# Patient Record
Sex: Male | Born: 1970 | Race: Black or African American | Hispanic: No | Smoking: Never smoker
Health system: Southern US, Community
[De-identification: ages and names within clinical notes are randomized; demographics above are authoritative.]

## PROBLEM LIST (undated history)

## (undated) DIAGNOSIS — IMO0002 Reserved for concepts with insufficient information to code with codable children: Secondary | ICD-10-CM

## (undated) DIAGNOSIS — G4700 Insomnia, unspecified: Secondary | ICD-10-CM

## (undated) DIAGNOSIS — G629 Polyneuropathy, unspecified: Secondary | ICD-10-CM

## (undated) DIAGNOSIS — Z973 Presence of spectacles and contact lenses: Secondary | ICD-10-CM

## (undated) DIAGNOSIS — L989 Disorder of the skin and subcutaneous tissue, unspecified: Secondary | ICD-10-CM

## (undated) DIAGNOSIS — E785 Hyperlipidemia, unspecified: Secondary | ICD-10-CM

## (undated) DIAGNOSIS — R809 Proteinuria, unspecified: Secondary | ICD-10-CM

## (undated) DIAGNOSIS — G43909 Migraine, unspecified, not intractable, without status migrainosus: Secondary | ICD-10-CM

## (undated) DIAGNOSIS — I10 Essential (primary) hypertension: Secondary | ICD-10-CM

## (undated) DIAGNOSIS — E1165 Type 2 diabetes mellitus with hyperglycemia: Secondary | ICD-10-CM

## (undated) HISTORY — PX: VASECTOMY: SHX75

## (undated) HISTORY — DX: Polyneuropathy, unspecified: G62.9

## (undated) HISTORY — DX: Migraine, unspecified, not intractable, without status migrainosus: G43.909

## (undated) HISTORY — DX: Reserved for concepts with insufficient information to code with codable children: IMO0002

## (undated) HISTORY — DX: Disorder of the skin and subcutaneous tissue, unspecified: L98.9

## (undated) HISTORY — DX: Type 2 diabetes mellitus with hyperglycemia: E11.65

## (undated) HISTORY — DX: Presence of spectacles and contact lenses: Z97.3

## (undated) HISTORY — DX: Hyperlipidemia, unspecified: E78.5

## (undated) HISTORY — DX: Essential (primary) hypertension: I10

## (undated) HISTORY — DX: Proteinuria, unspecified: R80.9

---

## 1998-03-06 ENCOUNTER — Emergency Department (HOSPITAL_COMMUNITY): Admission: EM | Admit: 1998-03-06 | Discharge: 1998-03-06 | Payer: Self-pay | Admitting: Emergency Medicine

## 1998-05-01 ENCOUNTER — Emergency Department (HOSPITAL_COMMUNITY): Admission: EM | Admit: 1998-05-01 | Discharge: 1998-05-01 | Payer: Self-pay | Admitting: Emergency Medicine

## 2000-06-24 ENCOUNTER — Emergency Department (HOSPITAL_COMMUNITY): Admission: EM | Admit: 2000-06-24 | Discharge: 2000-06-24 | Payer: Self-pay | Admitting: Emergency Medicine

## 2000-10-03 ENCOUNTER — Emergency Department (HOSPITAL_COMMUNITY): Admission: EM | Admit: 2000-10-03 | Discharge: 2000-10-03 | Payer: Self-pay | Admitting: *Deleted

## 2001-03-28 ENCOUNTER — Emergency Department (HOSPITAL_COMMUNITY): Admission: EM | Admit: 2001-03-28 | Discharge: 2001-03-28 | Payer: Self-pay | Admitting: Emergency Medicine

## 2001-03-28 ENCOUNTER — Encounter: Payer: Self-pay | Admitting: Emergency Medicine

## 2003-11-25 ENCOUNTER — Emergency Department (HOSPITAL_COMMUNITY): Admission: EM | Admit: 2003-11-25 | Discharge: 2003-11-25 | Payer: Self-pay | Admitting: Family Medicine

## 2005-12-15 ENCOUNTER — Encounter: Payer: Self-pay | Admitting: Emergency Medicine

## 2006-01-23 ENCOUNTER — Inpatient Hospital Stay (HOSPITAL_COMMUNITY): Admission: EM | Admit: 2006-01-23 | Discharge: 2006-01-26 | Payer: Self-pay | Admitting: Emergency Medicine

## 2006-02-04 ENCOUNTER — Encounter: Payer: Self-pay | Admitting: Endocrinology

## 2007-10-18 ENCOUNTER — Ambulatory Visit: Payer: Self-pay | Admitting: Endocrinology

## 2007-10-18 DIAGNOSIS — Z794 Long term (current) use of insulin: Secondary | ICD-10-CM | POA: Insufficient documentation

## 2007-10-18 DIAGNOSIS — E119 Type 2 diabetes mellitus without complications: Secondary | ICD-10-CM | POA: Insufficient documentation

## 2007-10-18 DIAGNOSIS — R209 Unspecified disturbances of skin sensation: Secondary | ICD-10-CM | POA: Insufficient documentation

## 2007-10-18 DIAGNOSIS — G43909 Migraine, unspecified, not intractable, without status migrainosus: Secondary | ICD-10-CM | POA: Insufficient documentation

## 2007-11-22 ENCOUNTER — Encounter (INDEPENDENT_AMBULATORY_CARE_PROVIDER_SITE_OTHER): Payer: Self-pay | Admitting: *Deleted

## 2008-01-16 ENCOUNTER — Encounter: Payer: Self-pay | Admitting: Endocrinology

## 2008-01-17 ENCOUNTER — Telehealth: Payer: Self-pay | Admitting: Endocrinology

## 2008-01-19 ENCOUNTER — Telehealth: Payer: Self-pay | Admitting: Endocrinology

## 2011-01-01 NOTE — Discharge Summary (Signed)
NAME:  [name], [name]                 ACCOUNT NO.:  [id number]   MEDICAL RECORD NO.:  [id number]          PATIENT TYPE:  INP   LOCATION:  5732                         FACILITY:  [facility]   PHYSICIAN:  Melissa L. Ladona Ridgel, MD  DATE OF BIRTH:  Sep 21, 1970   DATE OF ADMISSION:  01/23/2006  DATE OF DISCHARGE:  01/26/2006                                 DISCHARGE SUMMARY   CHIEF COMPLAINT ON ADMISSION:  Elevated blood sugars with diagnosis of DKA  based on his anion gap.   DISCHARGE DIAGNOSES:  1. Uncontrolled diabetes with diabetic ketoacidosis. The patient was      hydrated and started on Glucomander with ultimate control of his blood      sugars. He was restarted on his oral medications after reaching goal      blood sugar control which included Lantus. At the time of discharge,      the patient's blood sugars were still slightly elevated, but he was      treated for apical abscess and referred for outpatient continued      management of his diabetes.  2. Apical abscess. The patient evidently had dental abscess which he did      not reveal at the time of admission. He was started empirically on      Augmentin and discharged to home with Augmentin.  3. Acute renal failure. This resolved with hydration. ACE inhibitor was      recommended in the future once he was more stable, but at this time,      because of the recent renal failure, this was not started as an      inpatient. Leukocytosis had resolved at the time of discharge likely      secondary to his metabolic derangement and apical abscess.   MEDICATIONS AT DISCHARGE:  1. Augmentin 875 p.o. b.i.d.  2. Lantus 20 units subcutaneously daily.  3. Glucophage 850 b.i.d.  4. Protonix 40 mg daily.  5. Actos 15 mg daily.  6. Percocet 1 to 2 tablets every 4 hours.  7. Ambien 5 mg every night p.r.n. for sleep.   FOLLOW UP:  The patient was to follow up with Dr. Georgina Pillion in 10 to 14 days  and was referred to the dentist of his choice.   SPECIAL  INSTRUCTIONS:  He is to report to his doctor if he has any problems  related to elevated blood sugars and to follow up for education at the  diabetes center.   HOSPITAL COURSE:  The patient is a pleasant 40 year old African-American  male who presented to the hospital with fatigue and elevated blood sugars.  He was found to have a fasting blood sugar of 925 with an anion gap. The  patient was admitted to the hospital, placed on a Glucomander insulin drip  which was titrated to effect. It was discovered during the course of the  hospital stay that the patient actually had a dental abscess which he had  not revealed. Therefore, he was started on Augmentin. He was aggressively  rehydrated which improved his acute renal failure which noted at  the time of  admission. The patient's pain was controlled with multiple medications, and  at the time of admission, he was discharged to home on oral Percocet as well  as Augmentin.   On the day of discharge, the patient was noted to be hemodynamically stable  by Dr. Greggory Stallion Osei-Bonsu with good blood pressure. His blood sugars remained  slightly elevated, but he was referred for outpatient diabetic care and to  his primary care physician for further monitoring.   PERTINENT LABORATORY DATA:  At the time of discharge, the patient remained  afebrile. His white count on admission was 14.7, and at the time discharge,  it was 5.4. His BUN and creatinine at the time of admission were 20 and 2.0,  respectively, but at discharge, they went down to 14 and 1.0, respectively.  His hemoglobin A1c was 12.6. His urinalysis was negative. His cardiac  markers were negative.   DISPOSITION:  At the time of discharge, the patient was deemed stable for  followup as an outpatient with his dentist and his primary care physician.  He was discharged to home. Condition was stable.      Melissa L. Ladona Ridgel, MD  Electronically Signed     MLT/MEDQ  D:  04/01/2006  T:   04/01/2006  Job:  [id number]   cc:   Oley Balm. Georgina Pillion, M.D.

## 2011-01-01 NOTE — H&P (Signed)
NAME:  [name] [name], [name] [name]                 ACCOUNT NO.:  [id number]   MEDICAL RECORD NO.:  [id number]          PATIENT TYPE:  INP   LOCATION:  1825                         FACILITY:  [facility]   PHYSICIAN:  Hollice Espy, M.D.DATE OF BIRTH:  13-Apr-1971   DATE OF ADMISSION:  01/23/2006  DATE OF DISCHARGE:                                HISTORY & PHYSICAL   ATTENDING PHYSICIAN:  Nadear A. Arthor Captain, M.D.   PRIMARY CARE PHYSICIAN:  Oley Balm. Georgina Pillion, M.D.   CHIEF COMPLAINT:  Nausea, vomiting, and weakness.   HISTORY OF PRESENT ILLNESS:  The patient is a 40 year old African-American  male with past medical history of glucose intolerance who presents to the  emergency room after several days of weakness.  The patient tells me that  approximately 6 months ago he was diagnosed with borderline diabetes.  He  followed up with his primary care physician approximately 4-5 weeks ago, and  it was found his sugars were still high.  He was started on oral diabetic  medications, and the patient tells me that he has been trying to take his  medications; however, whenever he checks his sugar, it has always been  reported as high.  Over the last several weeks, he tells me that he has been  sleeping very minimally because he feels like he is up all the time  urinating.  To compensate for this, he has been trying to drink lots of  water, but it has been to no avail.  He continued to have problems and felt  so weak to today that he finally came in to the emergency room.   In the emergency room, the patient was noted to sodium of 122, a BUN of 20,  creatinine of 2, and a glucose of 925.  The patient had an anion gap of 19.  Vial signs were taken on the patient.  He was noted to be slightly  tachycardic with a heart rate of 104. Blood pressure was stable at 132/91.  The rest of his labs were checked, and he was noted to have a urine glucose  of greater than 1000 and 40 ketones.  Following administration of IV  fluids  and insulin, the patient's BUN had come down to 24 with a creatinine of 1.4.  CPK was only slightly elevated at 237.  The patient denied any other  complaints other than feeling extremely fatigued and weak.  He denies any  headache, vision changes, dysphagia, chest pain, palpitations, shortness of  breath, wheeze, cough, abdominal pain, hematuria, dysuria, constipation,  diarrhea, focal extremity numbness or weakness or pain.  Overall, he feels  quite weak and fatigued.   REVIEW OF SYSTEMS:  Otherwise negative.   PAST MEDICAL HISTORY:  Includes glucose intolerance and now full-blown  diabetes, type 2.   MEDICATIONS:  The patient had just been recently started in the past 6 weeks  on ACTO plus met 15/500 p.o. b.i.d.   ALLERGIES:  The patient has no known drug allergies.   SOCIAL HISTORY:  He denies any tobacco or drug use.  He occasionally drinks  a beer socially.   FAMILY HISTORY:  Notable for diabetes.   PHYSICAL EXAMINATION:  VITAL SIGNS:  The patient's vital signs on admission  show temperature 98.4, heart rate 104, now down to 93, blood pressure  132/91, respirations 18, O2 saturation 99% on room air.  GENERAL: The patient is alert and oriented x3, in no apparent distress.  HEENT:  Normocephalic and atraumatic.  Mucous membranes are dry.  NECK:  He has no carotid bruits.  HEART:  Regular rate and rhythm.  S1, S2.  LUNGS: Clear to auscultation bilaterally.  ABDOMEN: Soft, obese, nontender.  Positive bowel sounds.  EXTREMITIES:  No clubbing, cyanosis, or edema.   LABORATORY DATA:  The patient's initial BMET showed sodium 122, potassium  7.4, chloride 86, bicarb 17, BUN 20, creatinine 2, glucose 925.  UA: Greater  than 1000 glucose, 40 ketones, 2 red cells.  CPK 237, MB less than 1,  troponin I less than 0.05.  Second BMET approximately 1 hour later: Sodium  122, potassium 6.4 by hemolyzed, BUN 24, creatinine 1.4.   ASSESSMENT AND PLAN:  1.  Nonketotic hyperglycemia  with some mild component of acidosis.  Would      favor at this time treating as a nonketotic given the elevated blood      sugars and his history.  Would favor lots of IV hydration as well as      insulin Glucomander.  Will continue to follow.  Once his sugars are      below 200, will change him over to insulin sliding scale 3 times a day.  2.  Acute renal failure, already improved with some IV fluids.  Continue IV      fluids until patient is back to normal.  I do not feel at this time he      has any baseline renal insufficiency; however, continued tight control      of his  diabetes will ensure that he stays this way.  3.  Anion gap acidosis.  This may be from uremia versus his renal failure.      We will treat and recheck a BMET in the morning.  4.  Polyuria secondary to hyperglycemia.  5.  Obesity.      Hollice Espy, M.D.  Electronically Signed     SKK/MEDQ  D:  01/23/2006  T:  01/23/2006  Job:  [id number]   cc:   Oley Balm. Georgina Pillion, M.D.  Fax: [phone]

## 2012-07-05 ENCOUNTER — Ambulatory Visit (INDEPENDENT_AMBULATORY_CARE_PROVIDER_SITE_OTHER): Payer: BC Managed Care – PPO | Admitting: Medical

## 2012-07-05 ENCOUNTER — Encounter: Payer: Self-pay | Admitting: Medical

## 2012-07-05 VITALS — BP 118/80 | HR 72 | Temp 98.2°F | Resp 16 | Ht 70.0 in | Wt 177.0 lb

## 2012-07-05 DIAGNOSIS — E785 Hyperlipidemia, unspecified: Secondary | ICD-10-CM

## 2012-07-05 DIAGNOSIS — G2581 Restless legs syndrome: Secondary | ICD-10-CM

## 2012-07-05 DIAGNOSIS — Z23 Encounter for immunization: Secondary | ICD-10-CM

## 2012-07-05 DIAGNOSIS — Z0000 Encounter for general adult medical examination without abnormal findings: Secondary | ICD-10-CM

## 2012-07-05 DIAGNOSIS — IMO0001 Reserved for inherently not codable concepts without codable children: Secondary | ICD-10-CM

## 2012-07-05 LAB — CBC WITH DIFFERENTIAL/PLATELET
Basophils Absolute: 0 10*3/uL (ref 0.0–0.1)
Eosinophils Absolute: 0 10*3/uL (ref 0.0–0.7)
Eosinophils Relative: 1 % (ref 0–5)
HCT: 51 % (ref 39.0–52.0)
Lymphocytes Relative: 37 % (ref 12–46)
Lymphs Abs: 2.2 10*3/uL (ref 0.7–4.0)
MCH: 28.2 pg (ref 26.0–34.0)
MCV: 84.2 fL (ref 78.0–100.0)
Monocytes Absolute: 0.4 10*3/uL (ref 0.1–1.0)
Platelets: 380 10*3/uL (ref 150–400)
RDW: 13.4 % (ref 11.5–15.5)

## 2012-07-05 LAB — POCT URINALYSIS DIPSTICK
Glucose, UA: NEGATIVE
Leukocytes, UA: NEGATIVE
Nitrite, UA: NEGATIVE
Spec Grav, UA: 1.02
Urobilinogen, UA: NEGATIVE
pH, UA: 5

## 2012-07-05 LAB — COMPREHENSIVE METABOLIC PANEL
ALT: 11 U/L (ref 0–53)
Alkaline Phosphatase: 84 U/L (ref 39–117)
Creat: 0.95 mg/dL (ref 0.50–1.35)
Sodium: 134 mEq/L — ABNORMAL LOW (ref 135–145)
Total Bilirubin: 2.3 mg/dL — ABNORMAL HIGH (ref 0.3–1.2)
Total Protein: 7.9 g/dL (ref 6.0–8.3)

## 2012-07-05 LAB — HEMOGLOBIN A1C: Hgb A1c MFr Bld: 14 % — ABNORMAL HIGH (ref <5.7)

## 2012-07-05 LAB — LIPID PANEL
Cholesterol: 277 mg/dL — ABNORMAL HIGH (ref 0–200)
HDL: 39 mg/dL — ABNORMAL LOW (ref >39)
LDL Cholesterol: 221 mg/dL — ABNORMAL HIGH (ref 0–99)
Triglycerides: 84 mg/dL (ref <150)

## 2012-07-05 NOTE — Progress Notes (Signed)
Subjective:   HPI  [name] [name] is a 41 y.o. male who presents for a complete physical.  New patient today.   He reports being on medication for diabetes and cholesterol over a year ago, diagnosed age 66yo, but let things go this past year going through bad divorce.  He wants to get back on track.  He was formerly 250lb but had lost a lot of weight.     Preventative care: Last ophthalmology visit:6 months Last dental visit:Dr.hobbs,every 22mo Last colonoscopy:n/a Last prostate exam: 5 years ago Last EKG:n/a Last labs:n/a  Prior vaccinations: TD or Tdap:unsure Influenza:05/2012 Pneumococcal:n/a Shingles/Zostavax:n/a  Advanced directive:n/a Health care power of attorney:n/a Living will:n/a  Concerns: diabetes - not checking glucose, no meter, but tries to eat healthy, does get exercise with walking at work.  No medication in over a year but knows his numbers are going to be out of whack  Leg discomfort, usually in the evening, not specifically pain, not particular restless/moving, but achy bilat.  Not worsened with walking.  Worse at rest.  Reviewed their medical, surgical, family, social, medication, and allergy history and updated chart as appropriate.   Past Medical History  Diagnosis Date  . Hyperlipidemia   . Wears glasses   . Migraine   . Unspecified disorder of skin and subcutaneous tissue     recurrent summertime rash, large lesions scattered?  . Diabetes mellitus type II, uncontrolled     diagnosed age 72yo; week long hospitalization at diagnosis     Past Surgical History  Procedure Date  . Vasectomy     Family History  Problem Relation Age of Onset  . Diabetes Maternal Aunt   . Diabetes Maternal Uncle   . Diabetes Maternal Grandmother   . Heart disease Neg Hx   . Stroke Neg Hx   . Cancer Neg Hx     History   Social History  . Marital Status: Married    Spouse Name: N/A    Number of Children: N/A  . Years of Education: N/A   Occupational  History  . Not on file.   Social History Main Topics  . Smoking status: Never Smoker   . Smokeless tobacco: Not on file  . Alcohol Use: No  . Drug Use: No  . Sexually Active: Not on file   Other Topics Concern  . Not on file   Social History Narrative   Divorced, 2 children 14yo, 16yo, lives with brother and daughter, is an Event organiser for [employer]    No current outpatient prescriptions on file prior to visit.    No Known Allergies  Review of Systems Constitutional: -fever, -chills, -sweats, +unexpected weight change, -decreased appetite, -fatigue Allergy: -sneezing, -itching, -congestion Dermatology: -changing moles, --rash, -lumps ENT: -runny nose, -ear pain, -sore throat, -hoarseness, -sinus pain, -teeth pain, - ringing in ears, -hearing loss, -nosebleeds Cardiology: -chest pain, -palpitations, -swelling, -difficulty breathing when lying flat, -waking up short of breath Respiratory: +cough, -shortness of breath, -difficulty breathing with exercise or exertion, -wheezing, -coughing up blood Gastroenterology: -abdominal pain, -nausea, -vomiting, -diarrhea, -constipation, -blood in stool, -changes in bowel movement, -difficulty swallowing or eating Hematology: -bleeding, -bruising  Musculoskeletal: -joint aches, -muscle aches, -joint swelling, -back pain, -neck pain, -cramping, -changes in gait, + leg pain Ophthalmology: denies vision changes, eye redness, itching, discharge Urology: -burning with urination, -difficulty urinating, -blood in urine, -urinary frequency, -urgency, -incontinence Neurology: -headache, -weakness, -tingling, -numbness, -memory loss, -falls, -dizziness Psychology: -depressed mood, -agitation, +sleep problems  Objective:   Physical Exam  Filed Vitals:   07/05/12 0941  Height: 5\' 10"  (1.778 m)  Weight: 177 lb (80.287 kg)   General appearance: alert, no distress, WD/WN, pleasant AA male Skin: no foot lesions.  Loose skin with stretch  marks along lower abdomen, from prior pannus, similar loose skin of buttocks HEENT: normocephalic, conjunctiva/corneas normal, sclerae anicteric, PERRLA, EOMi, nares patent, no discharge or erythema, pharynx normal Oral cavity: MMM, tongue normal, teeth in good repair Neck: supple, no lymphadenopathy, no thyromegaly, no masses, normal ROM, no bruits Chest: non tender, normal shape and expansion Heart: RRR, normal S1, S2, no murmurs Lungs: CTA bilaterally, no wheezes, rhonchi, or rales Abdomen: +bs, soft, non tender, non distended, no masses, no hepatomegaly, no splenomegaly, no bruits Back: non tender, normal ROM, no scoliosis Musculoskeletal: upper extremities non tender, no obvious deformity, normal ROM throughout, lower extremities non tender, no obvious deformity, normal ROM throughout Extremities: no edema, no cyanosis, no clubbing Pulses: 2+ symmetric, upper and lower extremities, normal cap refill Neurological: alert, oriented x 3, CN2-12 intact, strength normal upper extremities and lower extremities, sensation normal throughout, DTRs 2+ throughout, no cerebellar signs, gait normal, monofilament exam normal Psychiatric: normal affect, behavior normal, pleasant  GU: normal male external genitalia, circumcised, nontender, no masses, no hernia, no lymphadenopathy Rectal: anus normal appearing, normal tone, no obvious lesions, prostate WNL   Assessment and Plan :      Encounter Diagnoses  Name Primary?  . Routine general medical examination at a health care facility Yes  . Type II or unspecified type diabetes mellitus without mention of complication, uncontrolled   . Hyperlipidemia   . Need for Tdap vaccination   . Need for pneumococcal vaccination   . RLS (restless legs syndrome)     Physical exam - discussed healthy lifestyle, diet, exercise, preventative care, vaccinations, and addressed their concerns.    Labs today for baseline, most likely will restart medication for  cholesterol, diabetes.  Advised he needs to take care of himself and discussed possible consequences of uncontrolled diabetes.  tdap vaccine, VIS and counseling given  pneumococcal vaccine, VIS, and counseling given  Just had flu vaccine a month ago  RLS - symptoms suggest RLQ, vs neuropathy.  Pending labs, consider Requip.  Follow-up pending labs

## 2012-07-06 ENCOUNTER — Encounter: Payer: Self-pay | Admitting: Medical

## 2012-07-06 LAB — MICROALBUMIN / CREATININE URINE RATIO: Microalb Creat Ratio: 60.4 mg/g — ABNORMAL HIGH (ref 0.0–30.0)

## 2012-07-07 ENCOUNTER — Other Ambulatory Visit: Payer: Self-pay | Admitting: Medical

## 2012-07-07 ENCOUNTER — Telehealth: Payer: Self-pay | Admitting: Family Medicine

## 2012-07-07 MED ORDER — INSULIN ASPART 100 UNIT/ML ~~LOC~~ SOLN: 10.0000 [IU] | Freq: Three times a day (TID) | SUBCUTANEOUS | Status: DC

## 2012-07-07 NOTE — Telephone Encounter (Signed)
i spoke to pt.  He has used insulin in the past.   i called out novolog flexpen.  He will begin 10 units AC, check glucose TID and f/u Monday.    i advised we get insulin on board to lower his sugars.

## 2012-07-07 NOTE — Telephone Encounter (Signed)
Patients prior medication from the pharmacy are Altace 5 mg, Simivastin 20 mg, Amaryl 4 mg, Jaunmet 50/ 5 mg, Lisinopril 10 mg, Glipizide , Metformin 850 mg, Zocor 20 mg, Glucotrol XR 500 mg, and Actos 45 mg. CLS [facility] pharmacy

## 2012-07-10 ENCOUNTER — Ambulatory Visit (INDEPENDENT_AMBULATORY_CARE_PROVIDER_SITE_OTHER): Payer: BC Managed Care – PPO | Admitting: Medical

## 2012-07-10 ENCOUNTER — Encounter: Payer: Self-pay | Admitting: Medical

## 2012-07-10 VITALS — BP 122/80 | HR 88 | Temp 97.8°F | Resp 16 | Wt 178.0 lb

## 2012-07-10 DIAGNOSIS — IMO0001 Reserved for inherently not codable concepts without codable children: Secondary | ICD-10-CM

## 2012-07-10 DIAGNOSIS — R809 Proteinuria, unspecified: Secondary | ICD-10-CM

## 2012-07-10 DIAGNOSIS — E785 Hyperlipidemia, unspecified: Secondary | ICD-10-CM

## 2012-07-10 MED ORDER — ATORVASTATIN CALCIUM 40 MG PO TABS: 40.0000 mg | ORAL_TABLET | Freq: Every day | ORAL | Status: DC

## 2012-07-10 MED ORDER — LISINOPRIL 5 MG PO TABS: 5.0000 mg | ORAL_TABLET | Freq: Every day | ORAL | Status: DC

## 2012-07-10 NOTE — Patient Instructions (Addendum)
Sliding Scale dosing for your insulin.  Pre- meal glucose reading: If glucose is 60 or less, give 0 units of insulin/Novolog Flex Pen If glucose is 100 - 150, give 10 units of insulin/Novolog Flex Pen If glucose is 151 - 200, give 12 units of insulin/Novolog Flex Pen If glucose is 201 - 250, give 14 units of insulin/Novolog Flex Pen If glucose is 251 - 300, give 16 units of insulin/Novolog Flex Pen If glucose is 301 - 350, give 18 units of insulin/Novolog Flex Pen If glucose is 351 - 400, give 20 units of insulin/Novolog Flex Pen If glucose is 401 - 450, give 22 units of insulin/Novolog Flex Pen If glucose is 451 call me   Begin Lipitor for cholesterol, once daily at bedtime. Begin Lisinopril 5mg  daily for kidney protection

## 2012-07-10 NOTE — Progress Notes (Signed)
Subjective: Here for diabetes f/u.  I saw him last week as a new patient .  His labs are abnormal and he is here to discuss.   He has hx/o diabetes, high cholesterol, no medication in at least a year while he was going through divorce and stress related to this.   Past Medical History  Diagnosis Date  . Hyperlipidemia   . Wears glasses   . Migraine   . Unspecified disorder of skin and subcutaneous tissue     recurrent summertime rash, large lesions scattered?  . Diabetes mellitus type II, uncontrolled     diagnosed age 41yo; week long hospitalization at diagnosis  . Microalbuminuria    Objective; Gen: wd, wn, nad  Assessment: Encounter Diagnoses  Name Primary?  . Type II or unspecified type diabetes mellitus without mention of complication, uncontrolled Yes  . Microalbuminuria   . Hyperlipidemia    Plan: He has started the insulin I called out over the weekend.   I reviewed his labs, discussed need to improve the toxicity of the high glucose in the short term, but to then transition to an ongoing regimen.   Printed sliding scale for him to use.  Begin back on Lisinopril for renal protection, Lipitor for hyperlipidemia, discussed diet, discussed monitoring glucose, using the log book, and demonstrated proper use of the Novolog Flexpen.  F/u in 39mo.    In 2 wk once he is comfortable with the plan, will add Actoplusmet to the regimen.  Of note, HgbA1C 14.0% with 298 mg/dl glucose on 16/10/96.  LDL 221.

## 2012-07-20 ENCOUNTER — Telehealth: Payer: Self-pay | Admitting: Medical

## 2012-07-20 NOTE — Telephone Encounter (Signed)
Tell him to make sure he drinks plenty of fluids, use as a bulk laxative like Metamucil on a daily basis. He still has trouble, have him set up an appointment for further discussion with Vincenza Hews

## 2012-07-20 NOTE — Telephone Encounter (Signed)
Patient is aware of what Dr. Susann Givens had suggested. CLS

## 2012-08-21 ENCOUNTER — Encounter: Payer: Self-pay | Admitting: Medical

## 2012-08-21 ENCOUNTER — Ambulatory Visit (INDEPENDENT_AMBULATORY_CARE_PROVIDER_SITE_OTHER): Payer: BC Managed Care – PPO | Admitting: Medical

## 2012-08-21 ENCOUNTER — Other Ambulatory Visit: Payer: Self-pay | Admitting: Medical

## 2012-08-21 VITALS — BP 122/80 | HR 72 | Temp 98.3°F | Resp 16 | Wt 178.0 lb

## 2012-08-21 DIAGNOSIS — R809 Proteinuria, unspecified: Secondary | ICD-10-CM

## 2012-08-21 DIAGNOSIS — E785 Hyperlipidemia, unspecified: Secondary | ICD-10-CM

## 2012-08-21 DIAGNOSIS — K5900 Constipation, unspecified: Secondary | ICD-10-CM

## 2012-08-21 DIAGNOSIS — F430 Acute stress reaction: Secondary | ICD-10-CM

## 2012-08-21 DIAGNOSIS — G4700 Insomnia, unspecified: Secondary | ICD-10-CM

## 2012-08-21 DIAGNOSIS — IMO0001 Reserved for inherently not codable concepts without codable children: Secondary | ICD-10-CM

## 2012-08-21 MED ORDER — INSULIN GLARGINE 100 UNIT/ML ~~LOC~~ SOLN: 15.0000 [IU] | Freq: Every day | SUBCUTANEOUS | Status: DC

## 2012-08-21 MED ORDER — METFORMIN HCL 500 MG PO TABS: 500.0000 mg | ORAL_TABLET | Freq: Two times a day (BID) | ORAL | Status: DC

## 2012-08-21 MED ORDER — INSULIN DETEMIR 100 UNIT/ML ~~LOC~~ SOLN: 15.0000 [IU] | Freq: Every day | SUBCUTANEOUS | Status: DC

## 2012-08-21 NOTE — Patient Instructions (Signed)
Begin Metformin tablet by mouth.  Start 1 tablet daily for 3 days, then go to twice daily.  This is 500mg  twice daily.  Begin Lantus solostar pen.   This will be 15 units injected SQ at bedtime EVERY night.  This is a basal long acting insulin.    Continue Novolog Flexpen sliding scale at meal time.  This is short acting insulin.    Continue checking glucose numbers and call me with number in 1.5 weeks.  For sleep, try either Melatonin 1mg  OTC nightly or Benadryl 25mg  OTC nightly.   If this is helping sleep, let me know.

## 2012-08-21 NOTE — Progress Notes (Signed)
Subjective:   HPI  [name] [name] is a 42 y.o. male who presents for f/u.  Last visit we found his HgbA1C to be over 14%.  He had been off diabetes medication for some time.  We started him back on Novolog sliding scale insulin, lisinopril for renal protection, Lipitor for hyperlipidemia all of which he is doing but he notes feeling bad, is constipation, has no energy, not sure if its his body getting use to the  Medication vs stress and depressed mood dealing with custody battle with ex over his 14yo son.  He also c/o pain and tingling in back and top of feet as he did on prior visit that we discussed was likely some neuropathy.  No other c/o.  The following portions of the patient's history were reviewed and updated as appropriate: allergies, current medications, past family history, past medical history, past social history, past surgical history and problem list.  Past Medical History  Diagnosis Date  . Hyperlipidemia   . Wears glasses   . Migraine   . Unspecified disorder of skin and subcutaneous tissue     recurrent summertime rash, large lesions scattered?  . Diabetes mellitus type II, uncontrolled     diagnosed age 46yo; week long hospitalization at diagnosis  . Microalbuminuria     No Known Allergies   Review of Systems ROS reviewed and was negative other than noted in HPI or above.    Objective:   Physical Exam  General appearance: alert, no distress, WD/WN Psych: pleasant, answers questions appropriately  Assessment and Plan :     Encounter Diagnoses  Name Primary?  . Type II or unspecified type diabetes mellitus without mention of complication, uncontrolled Yes  . Microalbuminuria   . Hyperlipidemia   . Constipation   . Insomnia   . Acute stress reaction    Diabetes Type II - change in regimen today.   Add Lantus Solostar pen 15 units QHS, add Metformin 500mg  BID (which he has used in the past), c/t Novolog sliding scale, c/t checking glucose, call if any  hypoglycemia, and call in glucose readings in 1.5 weeks.   He is doing a good job keeping up with his readings on his phone.   Will likely tweak his lantus in 1.5 wk.  Plan to recheck labs in near future, next month or so.  Microalbuminuria - c/t Lisinopril  Hyperlipidemia- c/t Lipitor  Constipation - increase water and fiber intake, lets see what happens after beginning back on Metformin  Insomnia - try Melatonin or Benadryl OTC QHS prn  Acute stress reaction - discussed his concerns, consider counseling

## 2012-08-22 ENCOUNTER — Telehealth: Payer: Self-pay | Admitting: Family Medicine

## 2012-08-22 NOTE — Telephone Encounter (Signed)
Message copied by Janeice Robinson on Tue Aug 22, 2012 11:18 AM ------      Message from: Aleen Campi, DAVID S      Created: Mon Aug 21, 2012  8:22 PM       i called out Levemir instead of Lantus - its bascially the same type of medication.  Lets see if this is cheaper.  If not reasonable, let me know.  The dial up pens can be much more expensive.  If too costly, we can go for the multi use vials where he has to draw up the syringe, but just let me know.            Remind him that I want to see glucose numbers in 1.5 wk

## 2012-08-22 NOTE — Telephone Encounter (Signed)
Patient was made aware of the medication change and to let us know if the cost is going to be cheaper. CLS

## 2012-10-16 ENCOUNTER — Telehealth: Payer: Self-pay | Admitting: Internal Medicine

## 2012-10-16 NOTE — Telephone Encounter (Signed)
PT MADE APPT FOR 3/5

## 2012-10-16 NOTE — Telephone Encounter (Signed)
Due back for diabetes f/u.  pls make appt.  We can discuss sleep medications.

## 2012-10-16 NOTE — Telephone Encounter (Signed)
Pt states that he has tried over the counter sleeping meds and nothing is working and pt is just exhausted and can not get any rest and would like you to send in a sleep med so he can get some rest. The levemir that was prescribed in January has worked and his blood sugar have not been above 150 since being on med. His legs are still hurting him but some what improved

## 2012-10-16 NOTE — Telephone Encounter (Signed)
Send to [facility]- on [address]

## 2012-10-18 ENCOUNTER — Encounter: Payer: Self-pay | Admitting: Medical

## 2012-10-18 ENCOUNTER — Ambulatory Visit (INDEPENDENT_AMBULATORY_CARE_PROVIDER_SITE_OTHER): Payer: BC Managed Care – PPO | Admitting: Medical

## 2012-10-18 VITALS — BP 110/80 | HR 68 | Temp 98.4°F | Resp 16 | Ht 70.5 in | Wt 165.0 lb

## 2012-10-18 DIAGNOSIS — G4700 Insomnia, unspecified: Secondary | ICD-10-CM

## 2012-10-18 DIAGNOSIS — E785 Hyperlipidemia, unspecified: Secondary | ICD-10-CM

## 2012-10-18 DIAGNOSIS — R809 Proteinuria, unspecified: Secondary | ICD-10-CM

## 2012-10-18 DIAGNOSIS — R634 Abnormal weight loss: Secondary | ICD-10-CM

## 2012-10-18 DIAGNOSIS — IMO0001 Reserved for inherently not codable concepts without codable children: Secondary | ICD-10-CM

## 2012-10-18 MED ORDER — TRAZODONE HCL 50 MG PO TABS: 25.0000 mg | ORAL_TABLET | Freq: Every day | ORAL | Status: DC

## 2012-10-18 MED ORDER — METFORMIN HCL 1000 MG PO TABS: 1000.0000 mg | ORAL_TABLET | Freq: Two times a day (BID) | ORAL | Status: DC

## 2012-10-18 MED ORDER — ZOLPIDEM TARTRATE 10 MG PO TABS: 10.0000 mg | ORAL_TABLET | Freq: Every evening | ORAL | Status: DC | PRN

## 2012-10-18 NOTE — Progress Notes (Signed)
Subjective: Here for sleep concerns.  Averaging 3 hours nightly.  Been dealing with legal custody hearings, but he has come to peace with some decisions with his son and the custody issue.  Can't get through full workday due to being sleepy. Really affecting his day to day function.  Goes to bed but has trouble falling and staying asleep.   Has used zolpidem in the past.   Using some benadryl without much relief.  Wants to try some medication for sleep.  Diabetes- checking glucose regularly both before and after meals.  Compliant with medication, exercising some.  Has some occasional nausea.   No vomiting, diarrhea.  constipation symptom are better than last visit.  He has been losing weight of late.    Taking the cholesterol medication and renal protective medication without c/o.    Past Medical History  Diagnosis Date  . Hyperlipidemia   . Wears glasses   . Migraine   . Unspecified disorder of skin and subcutaneous tissue     recurrent summertime rash, large lesions scattered?  . Diabetes mellitus type II, uncontrolled     diagnosed age 42yo; week long hospitalization at diagnosis  . Microalbuminuria    Objective: Filed Vitals:   10/18/12 1336  BP: 110/80  Pulse: 68  Temp: 98.4 F (36.9 C)  Resp: 16    General appearance: alert, no distress, WD/WN  HEENT: normocephalic, sclerae anicteric, TMs pearly, nares patent, no discharge or erythema, pharynx normal Oral cavity: MMM, no lesions Neck: supple, no lymphadenopathy, no thyromegaly, no masses Heart: RRR, normal S1, S2, no murmurs Lungs: CTA bilaterally, no wheezes, rhonchi, or rales Abdomen: +bs, soft, non tender, non distended, no masses, no hepatomegaly, no splenomegaly Pulses: 2+ symmetric, upper and lower extremities, normal cap refill, Skin: no foot lesions, no callous, no would, normal monofilament exam  Assessment: Encounter Diagnoses  Name Primary?  . Insomnia Yes  . Weight loss   . Type II or unspecified type  diabetes mellitus without mention of complication, uncontrolled   . Hyperlipidemia   . Microalbuminuria     Plan: insomnia - trial of Trazodone QHS, prn script for zolpidem.  Discussed sleep hygiene, handout today.  Discussed risks/benefits of medication.  Recheck 1 wk pending medications.  Weight loss - likely adverse effect due to levemir and healthier diet.   We will modify medications today.  Return for fasting labs next week.  DM type II - we will increase Metformin 1000mg  BID, will back off Levemir only if ongoing weight loss and much lower glucose numbers  Hyperlipidemia - reutrn for fasting labs, compliant with medication  Microalbuminuria - c/t current medication.

## 2012-10-18 NOTE — Patient Instructions (Addendum)
Sleep   Begin Trazodone 50mg , 1 tablet nightly.     Short term as needed, you can also use Ambien 10mg , 1/2 or 1 tablet to help aid in getting to sleep.   This is something we don't want you to take every single though  Diabetes:  Just check before meal glucose readings.   For now, you can check 1-2 readings daily  Increase Metformin 500mg , to 2 tablets twice daily, starting by taking 2 in the morning, 1 at night for a week, then go to 2 tablets both morning and night  When you get the new prescription, it will be for 1000mg , taking 1 twice daily   Levemir - you are currently taking 20 units at bedtime.   The goal is to have pre meal glucose 60-130.  As long as you are not seeing low readings close to 60 before meals, continue 20 unites of Levemir at bedtime.     If you are seeing routine numbers on the low side, 60-70 reading, then possibly cut back to 15 units Levemir at bedtime.    return next week fasting for labs.    Insomnia Insomnia is frequent trouble falling and/or staying asleep. Insomnia can be a long term problem or a short term problem. Both are common. Insomnia can be a short term problem when the wakefulness is related to a certain stress or worry. Long term insomnia is often related to ongoing stress during waking hours and/or poor sleeping habits. Overtime, sleep deprivation itself can make the problem worse. Every little thing feels more severe because you are overtired and your ability to cope is decreased. CAUSES   Stress, anxiety, and depression.  Poor sleeping habits.  Distractions such as TV in the bedroom.  Naps close to bedtime.  Engaging in emotionally charged conversations before bed.  Technical reading before sleep.  Alcohol and other sedatives. They may make the problem worse. They can hurt normal sleep patterns and normal dream activity.  Stimulants such as caffeine for several hours prior to bedtime.  Pain syndromes and shortness of breath can  cause insomnia.  Exercise late at night.  Changing time zones may cause sleeping problems (jet lag). It is sometimes helpful to have someone observe your sleeping patterns. They should look for periods of not breathing during the night (sleep apnea). They should also look to see how long those periods last. If you live alone or observers are uncertain, you can also be observed at a sleep clinic where your sleep patterns will be professionally monitored. Sleep apnea requires a checkup and treatment. Give your caregivers your medical history. Give your caregivers observations your family has made about your sleep.  SYMPTOMS   Not feeling rested in the morning.  Anxiety and restlessness at bedtime.  Difficulty falling and staying asleep. TREATMENT   Your caregiver may prescribe treatment for an underlying medical disorders. Your caregiver can give advice or help if you are using alcohol or other drugs for self-medication. Treatment of underlying problems will usually eliminate insomnia problems.  Medications can be prescribed for short time use. They are generally not recommended for lengthy use.  Over-the-counter sleep medicines are not recommended for lengthy use. They can be habit forming.  You can promote easier sleeping by making lifestyle changes such as:  Using relaxation techniques that help with breathing and reduce muscle tension.  Exercising earlier in the day.  Changing your diet and the time of your last meal. No night time snacks.  Establish  a regular time to go to bed.  Counseling can help with stressful problems and worry.  Soothing music and white noise may be helpful if there are background noises you cannot remove.  Stop tedious detailed work at least one hour before bedtime. HOME CARE INSTRUCTIONS   Keep a diary. Inform your caregiver about your progress. This includes any medication side effects. See your caregiver regularly. Take note of:  Times when you are  asleep.  Times when you are awake during the night.  The quality of your sleep.  How you feel the next day. This information will help your caregiver care for you.  Get out of bed if you are still awake after 15 minutes. Read or do some quiet activity. Keep the lights down. Wait until you feel sleepy and go back to bed.  Keep regular sleeping and waking hours. Avoid naps.  Exercise regularly.  Avoid distractions at bedtime. Distractions include watching television or engaging in any intense or detailed activity like attempting to balance the household checkbook.  Develop a bedtime ritual. Keep a familiar routine of bathing, brushing your teeth, climbing into bed at the same time each night, listening to soothing music. Routines increase the success of falling to sleep faster.  Use relaxation techniques. This can be using breathing and muscle tension release routines. It can also include visualizing peaceful scenes. You can also help control troubling or intruding thoughts by keeping your mind occupied with boring or repetitive thoughts like the old concept of counting sheep. You can make it more creative like imagining planting one beautiful flower after another in your backyard garden.  During your day, work to eliminate stress. When this is not possible use some of the previous suggestions to help reduce the anxiety that accompanies stressful situations. MAKE SURE YOU:   Understand these instructions.  Will watch your condition.  Will get help right away if you are not doing well or get worse. Document Released: 07/30/2000 Document Revised: 10/25/2011 Document Reviewed: 08/30/2007 [facility] Patient Information 2013 [place], [place].

## 2012-10-23 ENCOUNTER — Other Ambulatory Visit: Payer: BC Managed Care – PPO

## 2012-10-23 DIAGNOSIS — IMO0001 Reserved for inherently not codable concepts without codable children: Secondary | ICD-10-CM

## 2012-10-23 DIAGNOSIS — E785 Hyperlipidemia, unspecified: Secondary | ICD-10-CM

## 2012-10-24 LAB — COMPREHENSIVE METABOLIC PANEL
AST: 20 U/L (ref 0–37)
Albumin: 4.6 g/dL (ref 3.5–5.2)
Alkaline Phosphatase: 62 U/L (ref 39–117)
Glucose, Bld: 120 mg/dL — ABNORMAL HIGH (ref 70–99)
Potassium: 4 mEq/L (ref 3.5–5.3)
Sodium: 138 mEq/L (ref 135–145)
Total Bilirubin: 1.9 mg/dL — ABNORMAL HIGH (ref 0.3–1.2)
Total Protein: 6.9 g/dL (ref 6.0–8.3)

## 2012-10-24 LAB — HEMOGLOBIN A1C: Mean Plasma Glucose: 180 mg/dL — ABNORMAL HIGH (ref <117)

## 2012-10-24 LAB — LIPID PANEL
HDL: 32 mg/dL — ABNORMAL LOW (ref >39)
LDL Cholesterol: 58 mg/dL (ref 0–99)
Total CHOL/HDL Ratio: 3.1 Ratio
VLDL: 9 mg/dL (ref 0–40)

## 2012-10-31 ENCOUNTER — Encounter (HOSPITAL_COMMUNITY): Payer: Self-pay | Admitting: *Deleted

## 2012-10-31 ENCOUNTER — Emergency Department (HOSPITAL_COMMUNITY)
Admission: EM | Admit: 2012-10-31 | Discharge: 2012-10-31 | Disposition: A | Discharge status: Home / Self Care | Payer: BC Managed Care – PPO | Attending: Emergency Medicine | Admitting: Emergency Medicine

## 2012-10-31 DIAGNOSIS — Z79899 Other long term (current) drug therapy: Secondary | ICD-10-CM | POA: Insufficient documentation

## 2012-10-31 DIAGNOSIS — E785 Hyperlipidemia, unspecified: Secondary | ICD-10-CM | POA: Insufficient documentation

## 2012-10-31 DIAGNOSIS — Z872 Personal history of diseases of the skin and subcutaneous tissue: Secondary | ICD-10-CM | POA: Insufficient documentation

## 2012-10-31 DIAGNOSIS — Z862 Personal history of diseases of the blood and blood-forming organs and certain disorders involving the immune mechanism: Secondary | ICD-10-CM | POA: Insufficient documentation

## 2012-10-31 DIAGNOSIS — G4700 Insomnia, unspecified: Secondary | ICD-10-CM | POA: Insufficient documentation

## 2012-10-31 DIAGNOSIS — R0609 Other forms of dyspnea: Secondary | ICD-10-CM | POA: Insufficient documentation

## 2012-10-31 DIAGNOSIS — Z794 Long term (current) use of insulin: Secondary | ICD-10-CM | POA: Insufficient documentation

## 2012-10-31 DIAGNOSIS — IMO0001 Reserved for inherently not codable concepts without codable children: Secondary | ICD-10-CM | POA: Insufficient documentation

## 2012-10-31 DIAGNOSIS — R0989 Other specified symptoms and signs involving the circulatory and respiratory systems: Secondary | ICD-10-CM | POA: Insufficient documentation

## 2012-10-31 DIAGNOSIS — G43909 Migraine, unspecified, not intractable, without status migrainosus: Secondary | ICD-10-CM | POA: Insufficient documentation

## 2012-10-31 DIAGNOSIS — R634 Abnormal weight loss: Secondary | ICD-10-CM | POA: Insufficient documentation

## 2012-10-31 NOTE — ED Notes (Addendum)
Pt states that he has not been able to sleep. Pt states that he has been rxed [place] and was still not able to sleep but a few hours a night (3-4) pt states that he saw his PCP and he told him to take 2 ambien and he still hasn't been able to sleep. Pt states that he switched PCP and saw his today [facility]) with a full blood work up. Pt feel tired, weak and wants to know why he cannot sleep.

## 2012-10-31 NOTE — ED Provider Notes (Signed)
History    42 year old male with insomnia. Has been ongoing issue for the past several months. Patient states that he has difficulty initiating and also maintaining sleep he has been taking Ambien which helps him fall asleep he often wakes up 3 or 4 hours later and is unable to get back to sleep. He is feeling tired throughout the day. Currently going through divorce. No acute stressors. Denies any significant alcohol intake. States that he does have some mild snoring, but never wakes up gasping for breath in the middle of the night. He states his snoring has actually improved with some recent weight loss. She denies feeling depressed.   CSN: [id number]  Arrival date & time 10/31/12  1910   First MD Initiated Contact with Patient 10/31/12 2014      Chief Complaint  Patient presents with  . Insomnia    (Consider location/radiation/quality/duration/timing/severity/associated sxs/prior treatment) HPI  Past Medical History  Diagnosis Date  . Hyperlipidemia   . Wears glasses   . Migraine   . Unspecified disorder of skin and subcutaneous tissue     recurrent summertime rash, large lesions scattered?  . Diabetes mellitus type II, uncontrolled     diagnosed age 74yo; week long hospitalization at diagnosis  . Microalbuminuria     Past Surgical History  Procedure Laterality Date  . Vasectomy      Family History  Problem Relation Age of Onset  . Diabetes Maternal Aunt   . Diabetes Maternal Uncle   . Diabetes Maternal Grandmother   . Heart disease Neg Hx   . Stroke Neg Hx   . Cancer Neg Hx     History  Substance Use Topics  . Smoking status: Never Smoker   . Smokeless tobacco: Not on file  . Alcohol Use: Yes     Comment: occasionally      Review of Systems  All systems reviewed and negative, other than as noted in HPI.   Allergies  Review of patient's allergies indicates no known allergies.  Home Medications   Current Outpatient Rx  Name  Route  Sig  Dispense   Refill  . atorvastatin (LIPITOR) 40 MG tablet   Oral   Take 1 tablet (40 mg total) by mouth daily.   30 tablet   5   . insulin aspart (NOVOLOG FLEXPEN) 100 UNIT/ML injection   Subcutaneous   Inject 10 Units into the skin 3 (three) times daily before meals.   3 pen   1   . insulin detemir (LEVEMIR) 100 UNIT/ML injection   Subcutaneous   Inject 20 Units into the skin at bedtime.         Marland Kitchen lisinopril (PRINIVIL,ZESTRIL) 5 MG tablet   Oral   Take 1 tablet (5 mg total) by mouth daily.   30 tablet   5   . metFORMIN (GLUCOPHAGE) 1000 MG tablet   Oral   Take 1 tablet (1,000 mg total) by mouth 2 (two) times daily with a meal.   60 tablet   3   . traZODone (DESYREL) 50 MG tablet   Oral   Take 0.5-1 tablets (25-50 mg total) by mouth at bedtime.   45 tablet   2   . zolpidem (AMBIEN) 10 MG tablet   Oral   Take 1 tablet (10 mg total) by mouth at bedtime as needed for sleep.   15 tablet   1     BP 124/82  Pulse 109  Temp(Src) 97.7 F (36.5 C) (Oral)  Resp  16  SpO2 99%  Physical Exam  Nursing note and vitals reviewed. Constitutional: He appears well-developed and well-nourished. No distress.  Tired appearing, but not toxic  HENT:  Head: Normocephalic and atraumatic.  Eyes: Conjunctivae are normal. Right eye exhibits no discharge. Left eye exhibits no discharge.  Neck: Neck supple.  Cardiovascular: Normal rate, regular rhythm and normal heart sounds.  Exam reveals no gallop and no friction rub.   No murmur heard. Pulmonary/Chest: Effort normal and breath sounds normal. No respiratory distress.  Abdominal: Soft. He exhibits no distension. There is no tenderness.  Musculoskeletal: He exhibits no edema and no tenderness.  Neurological: He is alert.  Skin: Skin is warm and dry. He is not diaphoretic.  Psychiatric: He has a normal mood and affect. His behavior is normal. Thought content normal.    ED Course  Procedures (including critical care time)  Labs Reviewed -  No data to display No results found.   1. Insomnia       MDM  42yM with insomnia. Pt already on ambien 10mg  which he reports occasionally taking additional doses of. Hesitant to prescribe additional sedating medications.  Unfortunately I do not know what I can additionally add to care on an emergent basis. He reports PCP ordered blood work today. Consider anemia, thyroid, etc as possible etiologies of fatigue but they shouldn't cause problems with sleep sustainability which seems to be his biggest issue. Discussed lifestyle modifications such as good sleep hygiene, regular exercise, avoiding alcohol, etc. Pt currently going through divorce. This stress likely contributing. Denies feeling depressed. Potentially sleep apnea, but doubt. I feel he is safe for DC at this time. Outpt FU.         Raeford Razor, MD 11/02/12 [phone]

## 2012-10-31 NOTE — ED Notes (Signed)
Pt not in room to review and sign for written discharge instructions.  Verbal discharge instructions review via Dr. Juleen China.

## 2012-11-01 ENCOUNTER — Telehealth: Payer: Self-pay | Admitting: Family Medicine

## 2012-11-01 NOTE — Telephone Encounter (Signed)
LMOM TO CB. CLS 

## 2012-11-01 NOTE — Telephone Encounter (Signed)
Message copied by Janeice Robinson on Wed Nov 01, 2012  5:13 PM ------      Message from: Jac Canavan      Created: Wed Nov 01, 2012  5:09 PM       Karren Burly: i saw he went to the ED for sleep problem.  In my last msg, I had also asked you to ask him what med of his daughter 's he took to help with sleep.  I never got an answer back? Please find out what the medication is.  We might can try that instead. ------

## 2012-11-02 ENCOUNTER — Emergency Department (HOSPITAL_COMMUNITY)
Admission: EM | Admit: 2012-11-02 | Discharge: 2012-11-02 | Disposition: A | Discharge status: Home / Self Care | Payer: BC Managed Care – PPO | Attending: Emergency Medicine | Admitting: Emergency Medicine

## 2012-11-02 ENCOUNTER — Encounter (HOSPITAL_COMMUNITY): Payer: Self-pay | Admitting: *Deleted

## 2012-11-02 ENCOUNTER — Other Ambulatory Visit (HOSPITAL_COMMUNITY): Payer: Self-pay

## 2012-11-02 ENCOUNTER — Emergency Department (HOSPITAL_COMMUNITY): Payer: BC Managed Care – PPO

## 2012-11-02 DIAGNOSIS — R634 Abnormal weight loss: Secondary | ICD-10-CM | POA: Insufficient documentation

## 2012-11-02 DIAGNOSIS — E785 Hyperlipidemia, unspecified: Secondary | ICD-10-CM | POA: Insufficient documentation

## 2012-11-02 DIAGNOSIS — R5381 Other malaise: Secondary | ICD-10-CM | POA: Insufficient documentation

## 2012-11-02 DIAGNOSIS — E109 Type 1 diabetes mellitus without complications: Secondary | ICD-10-CM

## 2012-11-02 DIAGNOSIS — R6889 Other general symptoms and signs: Secondary | ICD-10-CM | POA: Insufficient documentation

## 2012-11-02 DIAGNOSIS — G4700 Insomnia, unspecified: Secondary | ICD-10-CM | POA: Insufficient documentation

## 2012-11-02 DIAGNOSIS — Z789 Other specified health status: Secondary | ICD-10-CM | POA: Insufficient documentation

## 2012-11-02 DIAGNOSIS — Z872 Personal history of diseases of the skin and subcutaneous tissue: Secondary | ICD-10-CM | POA: Insufficient documentation

## 2012-11-02 DIAGNOSIS — Z8679 Personal history of other diseases of the circulatory system: Secondary | ICD-10-CM | POA: Insufficient documentation

## 2012-11-02 DIAGNOSIS — R5383 Other fatigue: Secondary | ICD-10-CM | POA: Insufficient documentation

## 2012-11-02 DIAGNOSIS — R079 Chest pain, unspecified: Secondary | ICD-10-CM

## 2012-11-02 DIAGNOSIS — E0590 Thyrotoxicosis, unspecified without thyrotoxic crisis or storm: Secondary | ICD-10-CM

## 2012-11-02 DIAGNOSIS — Z79899 Other long term (current) drug therapy: Secondary | ICD-10-CM | POA: Insufficient documentation

## 2012-11-02 HISTORY — DX: Insomnia, unspecified: G47.00

## 2012-11-02 LAB — COMPREHENSIVE METABOLIC PANEL
ALT: 16 U/L (ref 0–53)
AST: 18 U/L (ref 0–37)
Albumin: 4.1 g/dL (ref 3.5–5.2)
CO2: 24 mEq/L (ref 19–32)
Calcium: 9.7 mg/dL (ref 8.4–10.5)
Chloride: 97 mEq/L (ref 96–112)
GFR calc non Af Amer: >90 mL/min (ref >90)
Sodium: 135 mEq/L (ref 135–145)
Total Bilirubin: 1.5 mg/dL — ABNORMAL HIGH (ref 0.3–1.2)

## 2012-11-02 LAB — CBC
HCT: 43.5 % (ref 39.0–52.0)
Hemoglobin: 15.4 g/dL (ref 13.0–17.0)
MCH: 29.3 pg (ref 26.0–34.0)
MCV: 82.9 fL (ref 78.0–100.0)
Platelets: 353 10*3/uL (ref 150–400)
RDW: 12.5 % (ref 11.5–15.5)

## 2012-11-02 NOTE — ED Notes (Signed)
Pt to ED c/o R sided chest pain since 11 am.  For the last 3 months pt has been unable to sleep more than 3 hours per night, c/o decreased appetite and loss of 35 lbs without trying.

## 2012-11-02 NOTE — ED Notes (Addendum)
Pt c/o intermittent lower right sided CP that started this morning. Pt sts at the worst pain is a 7/10, lasting approx 30-45seconds. Pt reports he vomited X 1 today, but has been vomited on and off for almost 2 months. Pt has had a decreased appetite, lost 15lbs in the past month (pt sts he hasn't been trying, denies diet) and reports he has only been able to sleep about 3hrs. Pt has gone to see his PCP for these issues and just started going to a new PCP they think it could be his thyroid, drew bld work on Monday but didn't get the results back yet.

## 2012-11-02 NOTE — ED Provider Notes (Signed)
History     CSN: [id number]  Arrival date & time 11/02/12  1559   First MD Initiated Contact with Patient 11/02/12 2128      Chief Complaint  Patient presents with  . Chest Pain    (Consider location/radiation/quality/duration/timing/severity/associated sxs/prior treatment) HPI Comments: Patient is a 42 y/o M with PMHx of DM type I and high cholesterol (as reported from patient), presenting to the ED with chest pain and insomnia. Patient reported that chest pain started early this morning, described as intermittent, sporadic moderate episodes of sharp pain that lasts approximately 30-40 seconds, localized to the right lower chest wall without radiation. Patient reported no medications have been used for relief of pain. Denied shortness of breathe, palpitations, difficulty breathing, numbness/tingling to arms, abdominal pain, headaches, dizziness, fever.  Patient c/o insomnia that started approximately 3 months ago. Patient reported that he is unable to fall asleep and stay asleep. Patient reported that he gets approximately 2-4 hours of sleep per night. Patient reported being prescribed Ambien 10 mg from PCP, has been using that with little ability to fall asleep. Patient was seen on 10/31/2012 for insomnia in ED - nothing has changed according to patient. Denied snoring, orthopnea, previous episodes in the past. Associated symptoms are weight loss (30-40 pounds in past 3 months), decreased appetite, decreased energy.  Patient and mother reported that patient started seeing Dr. Kateri Plummer at [facility] at [place]. Both patient and mother reported that Dr. Kateri Plummer is working patient up for thyroid dysfunction - scheduled to have a thyroid scan on November 14, 2012.   Family history of thyroid disorders.      The history is provided by the patient.    Past Medical History  Diagnosis Date  . Hyperlipidemia   . Wears glasses   . Migraine   . Unspecified disorder of skin and subcutaneous tissue      recurrent summertime rash, large lesions scattered?  . Diabetes mellitus type II, uncontrolled     diagnosed age 21yo; week long hospitalization at diagnosis  . Microalbuminuria   . Insomnia     Past Surgical History  Procedure Laterality Date  . Vasectomy      Family History  Problem Relation Age of Onset  . Diabetes Maternal Aunt   . Diabetes Maternal Uncle   . Diabetes Maternal Grandmother   . Heart disease Neg Hx   . Stroke Neg Hx   . Cancer Neg Hx     History  Substance Use Topics  . Smoking status: Never Smoker   . Smokeless tobacco: Not on file  . Alcohol Use: Yes     Comment: occasionally      Review of Systems  Constitutional: Positive for fatigue.  HENT: Negative for ear pain, neck pain and tinnitus.   Eyes: Negative for pain.  Respiratory: Negative for chest tightness and shortness of breath.   Cardiovascular: Positive for chest pain.  Gastrointestinal: Negative for nausea, vomiting and abdominal pain.  Endocrine: Positive for cold intolerance.  Musculoskeletal: Negative for back pain.  Neurological: Negative for dizziness and headaches.  Psychiatric/Behavioral: Positive for sleep disturbance.  10 Systems reviewed and are negative for acute change except as noted in the HPI.   Allergies  Review of patient's allergies indicates no known allergies.  Home Medications   Current Outpatient Rx  Name  Route  Sig  Dispense  Refill  . atorvastatin (LIPITOR) 40 MG tablet   Oral   Take 40 mg by mouth daily.         Marland Kitchen  lisinopril (PRINIVIL,ZESTRIL) 5 MG tablet   Oral   Take 1 tablet (5 mg total) by mouth daily.   30 tablet   5   . metFORMIN (GLUCOPHAGE) 1000 MG tablet   Oral   Take 1 tablet (1,000 mg total) by mouth 2 (two) times daily with a meal.   60 tablet   3   . Multiple Vitamin (MULTIVITAMIN WITH MINERALS) TABS   Oral   Take 1 tablet by mouth daily.         Marland Kitchen zolpidem (AMBIEN) 10 MG tablet   Oral   Take 10 mg by mouth at bedtime as  needed for sleep. For sleep           BP 116/100  Pulse 70  Temp(Src) 97.1 F (36.2 C) (Oral)  Resp 16  SpO2 100%  Physical Exam  Nursing note and vitals reviewed. Constitutional: He appears well-developed and well-nourished.  Patient appeared tired, but not toxic or septic.   HENT:  Head: Normocephalic.  Nose: Nose normal.  Mouth/Throat: Oropharynx is clear and moist. No oropharyngeal exudate.  Eyes: Conjunctivae and EOM are normal. Pupils are equal, round, and reactive to light. Right eye exhibits no discharge. Left eye exhibits no discharge.  Neck: Normal range of motion. Neck supple.  No pain upon palpation to thyroid gland No thyroid bruit.  Cardiovascular: Normal rate, regular rhythm, normal heart sounds and intact distal pulses.   No murmur heard. Pulmonary/Chest: Effort normal and breath sounds normal. He has no wheezes. He has no rales.  Abdominal: Soft. Bowel sounds are normal. He exhibits no distension. There is no tenderness. There is no rebound.  Lymphadenopathy:    He has no cervical adenopathy.  Neurological: He is alert.  Skin: Skin is warm and dry. No rash noted. He is not diaphoretic. No erythema.  Psychiatric: His behavior is normal.  Patient appeared mildly upset, had one episode of crying because patient is frustrated with being fatigued.    ED Course  Procedures (including critical care time)  Labs Reviewed  COMPREHENSIVE METABOLIC PANEL - Abnormal; Notable for the following:    Glucose, Bld 175 (*)    Total Bilirubin 1.5 (*)    All other components within normal limits  CBC  POCT I-STAT TROPONIN I   Dg Chest 2 View  11/02/2012  *RADIOLOGY REPORT*  Clinical Data: Chest pain.  CHEST - 2 VIEW  Comparison: 01/24/2006.  Findings: The cardiac silhouette, mediastinal and hilar contours are normal.  The lungs are clear.  No pleural effusion.  The bony thorax is intact.  IMPRESSION: Normal chest x-ray.   Original Report Authenticated By: Rudie Meyer,  M.D.    Results for orders placed during the hospital encounter of 11/02/12  CBC      Result Value Range   WBC 7.6  4.0 - 10.5 K/uL   RBC 5.25  4.22 - 5.81 MIL/uL   Hemoglobin 15.4  13.0 - 17.0 g/dL   HCT 21.3  08.6 - 57.8 %   MCV 82.9  78.0 - 100.0 fL   MCH 29.3  26.0 - 34.0 pg   MCHC 35.4  30.0 - 36.0 g/dL   RDW 46.9  62.9 - 52.8 %   Platelets 353  150 - 400 K/uL  COMPREHENSIVE METABOLIC PANEL      Result Value Range   Sodium 135  135 - 145 mEq/L   Potassium 3.5  3.5 - 5.1 mEq/L   Chloride 97  96 - 112 mEq/L  CO2 24  19 - 32 mEq/L   Glucose, Bld 175 (*) 70 - 99 mg/dL   BUN 12  6 - 23 mg/dL   Creatinine, Ser 6.96  0.50 - 1.35 mg/dL   Calcium 9.7  8.4 - 29.5 mg/dL   Total Protein 7.3  6.0 - 8.3 g/dL   Albumin 4.1  3.5 - 5.2 g/dL   AST 18  0 - 37 U/L   ALT 16  0 - 53 U/L   Alkaline Phosphatase 67  39 - 117 U/L   Total Bilirubin 1.5 (*) 0.3 - 1.2 mg/dL   GFR calc non Af Amer >90  >90 mL/min   GFR calc Af Amer >90  >90 mL/min  POCT I-STAT TROPONIN I      Result Value Range   Troponin i, poc 0.00  0.00 - 0.08 ng/mL   Comment 3            Dg Chest 2 View  11/02/2012  *RADIOLOGY REPORT*  Clinical Data: Chest pain.  CHEST - 2 VIEW  Comparison: 01/24/2006.  Findings: The cardiac silhouette, mediastinal and hilar contours are normal.  The lungs are clear.  No pleural effusion.  The bony thorax is intact.  IMPRESSION: Normal chest x-ray.   Original Report Authenticated By: Rudie Meyer, M.D.       Date: 11/02/2012  Rate: 92  Rhythm: normal sinus rhythm  QRS Axis: left  Intervals: normal  ST/T Wave abnormalities: normal  Conduction Disutrbances:none  Narrative Interpretation:   Old EKG Reviewed: unchanged  Filed Vitals:   11/02/12 2300  BP: 116/100  Pulse: 70  Temp:   Resp: 16     1. Chest pain   2. Diabetes type 1, controlled   3. Insomnia       MDM  Patient is a 42 y/o M with PMHx of Type I DM, Hyperlipidemia, c/o chest pain and insomnia. Chest pain  localized to right lower chest, described as intermittent episodes that last approximately 30-40 seconds of sharp pain without radiation. Patient has been seen in the ED, 10/31/2012, for complaints of insomnia. Denied shortness of breathe, difficulty breathing, abdominal pain, fever, nausea.   I personally evaluated and examined the patient. Discussed case with Dr. Samuel Jester PE: patient appeared tired, but not toxic appearing. Lungs: CTA b/l. Cardiac: regular rate and rhythm, S1/S2 normal. Abdomen: non-distended, BS normoactive in all 4 quadrants, soft, nontender.   CBC and CMP negative findings Chest x-ray 2 view - negative findings EKG - normal sinus rhythm Troponin: 0.00  Suspected increase in fatigue, weight loss, insomnia, cold intolerance possible hypothyroidism. Patient afebrile, normotensive, non-tachycardic, alert. Patient is aseptic, non-toxic appearing. Discussed with patient the need to continue to follow-up with Dr. Kateri Plummer from [facility] at [place] for continuation of testing for possible thyroid dysfunction - patient reported having a thyroid scan scheduled for November 14, 2012. Discussed with patient to continue taking Ambien 10 mg or can stop taking Ambien and use Benadryl, patient reported that neither medication worked. Did not discharge patient with a sedative medication. Discussed with patient proper sleep hygiene. Possible counseling for divorce situation. Discussed that if symptoms worsen (fever, chills, abdominal pain, shortness of breathe) report back to the ED.  Patient agreed to plan of care, understood, all questions answered.      Raymon Mutton, PA-C 11/03/12 [phone]

## 2012-11-03 ENCOUNTER — Telehealth: Payer: Self-pay | Admitting: Family Medicine

## 2012-11-03 ENCOUNTER — Emergency Department (HOSPITAL_COMMUNITY)
Admission: EM | Admit: 2012-11-03 | Discharge: 2012-11-04 | Disposition: A | Discharge status: Home / Self Care | Payer: BC Managed Care – PPO | Attending: Emergency Medicine | Admitting: Emergency Medicine

## 2012-11-03 ENCOUNTER — Other Ambulatory Visit (HOSPITAL_COMMUNITY): Payer: Self-pay

## 2012-11-03 ENCOUNTER — Encounter (HOSPITAL_COMMUNITY): Payer: Self-pay | Admitting: Emergency Medicine

## 2012-11-03 DIAGNOSIS — Z8679 Personal history of other diseases of the circulatory system: Secondary | ICD-10-CM | POA: Insufficient documentation

## 2012-11-03 DIAGNOSIS — Z79899 Other long term (current) drug therapy: Secondary | ICD-10-CM | POA: Insufficient documentation

## 2012-11-03 DIAGNOSIS — E785 Hyperlipidemia, unspecified: Secondary | ICD-10-CM | POA: Insufficient documentation

## 2012-11-03 DIAGNOSIS — Z87448 Personal history of other diseases of urinary system: Secondary | ICD-10-CM | POA: Insufficient documentation

## 2012-11-03 DIAGNOSIS — R112 Nausea with vomiting, unspecified: Secondary | ICD-10-CM | POA: Insufficient documentation

## 2012-11-03 DIAGNOSIS — G4700 Insomnia, unspecified: Secondary | ICD-10-CM

## 2012-11-03 DIAGNOSIS — IMO0001 Reserved for inherently not codable concepts without codable children: Secondary | ICD-10-CM | POA: Insufficient documentation

## 2012-11-03 DIAGNOSIS — R079 Chest pain, unspecified: Secondary | ICD-10-CM | POA: Insufficient documentation

## 2012-11-03 DIAGNOSIS — E0590 Thyrotoxicosis, unspecified without thyrotoxic crisis or storm: Secondary | ICD-10-CM

## 2012-11-03 DIAGNOSIS — Z872 Personal history of diseases of the skin and subcutaneous tissue: Secondary | ICD-10-CM | POA: Insufficient documentation

## 2012-11-03 DIAGNOSIS — R5383 Other fatigue: Secondary | ICD-10-CM

## 2012-11-03 DIAGNOSIS — R531 Weakness: Secondary | ICD-10-CM

## 2012-11-03 DIAGNOSIS — R5381 Other malaise: Secondary | ICD-10-CM | POA: Insufficient documentation

## 2012-11-03 LAB — CBC WITH DIFFERENTIAL/PLATELET
Basophils Absolute: 0 10*3/uL (ref 0.0–0.1)
Eosinophils Relative: 2 % (ref 0–5)
HCT: 42.2 % (ref 39.0–52.0)
Hemoglobin: 14.9 g/dL (ref 13.0–17.0)
Lymphocytes Relative: 44 % (ref 12–46)
Lymphs Abs: 3.4 10*3/uL (ref 0.7–4.0)
MCV: 81 fL (ref 78.0–100.0)
Monocytes Absolute: 0.5 10*3/uL (ref 0.1–1.0)
Monocytes Relative: 7 % (ref 3–12)
Neutro Abs: 3.6 10*3/uL (ref 1.7–7.7)
RDW: 12.7 % (ref 11.5–15.5)
WBC: 7.7 10*3/uL (ref 4.0–10.5)

## 2012-11-03 LAB — POCT I-STAT, CHEM 8
BUN: 12 mg/dL (ref 6–23)
Calcium, Ion: 1.24 mmol/L — ABNORMAL HIGH (ref 1.12–1.23)
Chloride: 102 mEq/L (ref 96–112)
Creatinine, Ser: 0.7 mg/dL (ref 0.50–1.35)
Glucose, Bld: 167 mg/dL — ABNORMAL HIGH (ref 70–99)
TCO2: 29 mmol/L (ref 0–100)

## 2012-11-03 MED ORDER — SODIUM CHLORIDE 0.9 % IV BOLUS (SEPSIS)
1000.0000 mL | Freq: Once | INTRAVENOUS | Status: AC
  Administered 2012-11-03: 1000 mL via INTRAVENOUS

## 2012-11-03 NOTE — ED Provider Notes (Signed)
History     CSN: [id number]  Arrival date & time 11/03/12  [name] [name]   First MD Initiated Contact with Patient 11/03/12 1942      Chief Complaint  Patient presents with  . Nausea  . Weakness  . Emesis  . Chest Pain    (Consider location/radiation/quality/duration/timing/severity/associated sxs/prior treatment) Patient is a 42 y.o. male presenting with weakness.  Weakness This is a chronic problem. Episode onset: 3 months ago. The problem occurs constantly. The problem has been gradually worsening. Associated symptoms include chest pain, nausea, vomiting and weakness. Pertinent negatives include no abdominal pain, coughing or fever. Associated symptoms comments: insomnia. Nothing aggravates the symptoms. Treatments tried: [place]. The treatment provided no relief.    Past Medical History  Diagnosis Date  . Hyperlipidemia   . Wears glasses   . Migraine   . Unspecified disorder of skin and subcutaneous tissue     recurrent summertime rash, large lesions scattered?  . Diabetes mellitus type II, uncontrolled     diagnosed age 89yo; week long hospitalization at diagnosis  . Microalbuminuria   . Insomnia     Past Surgical History  Procedure Laterality Date  . Vasectomy      Family History  Problem Relation Age of Onset  . Diabetes Maternal Aunt   . Diabetes Maternal Uncle   . Diabetes Maternal Grandmother   . Heart disease Neg Hx   . Stroke Neg Hx   . Cancer Neg Hx     History  Substance Use Topics  . Smoking status: Never Smoker   . Smokeless tobacco: Not on file  . Alcohol Use: Yes     Comment: occasionally      Review of Systems  Constitutional: Positive for unexpected weight change. Negative for fever.  HENT: Negative for facial swelling.   Respiratory: Negative for cough and shortness of breath.   Cardiovascular: Positive for chest pain.  Gastrointestinal: Positive for nausea and vomiting. Negative for abdominal pain.  Genitourinary: Negative for difficulty  urinating.  Neurological: Positive for weakness.  All other systems reviewed and are negative.    Allergies  Review of patient's allergies indicates no known allergies.  Home Medications   Current Outpatient Rx  Name  Route  Sig  Dispense  Refill  . atenolol (TENORMIN) 25 MG tablet   Oral   Take 25 mg by mouth daily.         Marland Kitchen atorvastatin (LIPITOR) 40 MG tablet   Oral   Take 40 mg by mouth daily.         Marland Kitchen lisinopril (PRINIVIL,ZESTRIL) 5 MG tablet   Oral   Take 1 tablet (5 mg total) by mouth daily.   30 tablet   5   . metFORMIN (GLUCOPHAGE) 1000 MG tablet   Oral   Take 1 tablet (1,000 mg total) by mouth 2 (two) times daily with a meal.   60 tablet   3   . Multiple Vitamin (MULTIVITAMIN WITH MINERALS) TABS   Oral   Take 1 tablet by mouth daily.         Marland Kitchen zolpidem (AMBIEN) 10 MG tablet   Oral   Take 10 mg by mouth at bedtime as needed for sleep. For sleep           BP 115/88  Temp(Src) 98.3 F (36.8 C) (Oral)  Resp 20  SpO2 98%  Physical Exam  Nursing note and vitals reviewed. Constitutional: He is oriented to person, place, and time. He appears well-developed and  well-nourished. No distress.  HENT:  Head: Normocephalic and atraumatic.  Mouth/Throat: Oropharynx is clear and moist.  Eyes: Conjunctivae are normal. Pupils are equal, round, and reactive to light. No scleral icterus.  Neck: Normal range of motion. Neck supple.  Cardiovascular: Normal rate, regular rhythm, normal heart sounds and intact distal pulses.   No murmur heard. Pulmonary/Chest: Effort normal and breath sounds normal. No stridor. No respiratory distress. He has no wheezes. He has no rales.  Abdominal: Soft. He exhibits no distension. There is no tenderness.  Musculoskeletal: Normal range of motion. He exhibits no edema.  Neurological: He is alert and oriented to person, place, and time.  Skin: Skin is warm and dry. No rash noted.  Psychiatric: He has a normal mood and affect.  His behavior is normal.    ED Course  Procedures (including critical care time)  Labs Reviewed  POCT I-STAT, CHEM 8 - Abnormal; Notable for the following:    Glucose, Bld 167 (*)    Calcium, Ion 1.24 (*)    All other components within normal limits  CBC WITH DIFFERENTIAL  POCT I-STAT TROPONIN I   Dg Chest 2 View  11/02/2012  *RADIOLOGY REPORT*  Clinical Data: Chest pain.  CHEST - 2 VIEW  Comparison: 01/24/2006.  Findings: The cardiac silhouette, mediastinal and hilar contours are normal.  The lungs are clear.  No pleural effusion.  The bony thorax is intact.  IMPRESSION: Normal chest x-ray.   Original Report Authenticated By: Rudie Meyer, M.D.      1. Fatigue   2. Insomnia   3. Weakness       MDM   42 yo male with 3 months worth of worsening insomnia, fatigue, weakness, chest pain, nausea, and vomiting. No distress, but did appear fatigued on exam.  Abdomen soft nontender.  Not dehydrated.  Chest pain not c/w ACS and troponin drawn yesterday (at similar ED visit) and today both negative.  Labs yesterday and today unremarkable.  He has follow up with his primary doctor regarding this issue, but his mother is concerned that he needs to be admitted "to figure out what's wrong".  She is frustrated that their outpatient workup is taking a long time.  Pt does not have any indication for admission for his chronic medical problems.  I spoke with his PCP's office's on call physician who plans to work on close outpatient follow up for patient.  Discussed history, lab tests, and plan for outpatient management at length with family.  They were frustrated to not be admitted to the hospital, but agreed to follow up with his PCP.  They were given return precautions.         Rennis Petty, MD 11/04/12 [phone]

## 2012-11-03 NOTE — ED Notes (Signed)
Pt given urinal.  Lab at bedside.

## 2012-11-03 NOTE — Telephone Encounter (Signed)
LMOM TO CB. CLS 

## 2012-11-03 NOTE — ED Provider Notes (Signed)
I have supervised the resident on the management of this patient and agree with the note above. I personally interviewed and examined the patient and my addendum is below.   [name] [name] is a 42 y.o. male here with fatigue. Fatigue for 3 months and weight loss. Had 3 ER visits in the last week and had nl labs, CXR and EKG, neg troponin. Mother was concerned and brought him for eval. Vitals stable. Neuro exam unremarkable. Cardiopulmonary exam unremarkable. Good reflexes and nl gait. The resident talked with the PMD, who agreed that patient doesn't require inpatient work up (mother wanted patient to be admitted). Repeat CBC, chem 8 and trop negative. I think he can be worked up as outpatient. Stable for d/c.    Richardean Canal, MD 11/03/12 [phone]

## 2012-11-03 NOTE — Telephone Encounter (Signed)
Message copied by Janeice Robinson on Fri Nov 03, 2012  4:36 PM ------      Message from: Aleen Campi, DAVID S      Created: Fri Nov 03, 2012  1:33 PM       FYI - what was patient's response to your call on Wednesday, I don't recall what you told me he said.             I saw that pt went back to ED yesterday again.  The whole purpose of the primary care/patient relationship is to address things if not improving.  If things haven't improved, before going to the ED, call me, lets come up with a plan.   Per the ED note, he has went to a different doctor and is having some thyroid tests.  We could have helped do the same.              Thus, let him know we are here to help, and when things are not improving ( sleep, etc.), keep in touch and lets come up with a plan.  We obviously have gotten his sugars in much better shape, so we are trying to help him.  I understand the sleep is an issue, but I would have rather him work with me, instead of changing courses here.             So when is his thyroid evals? ------

## 2012-11-03 NOTE — ED Notes (Addendum)
Per mother, pt has been having Nausea, Vomiting and weakness for 3 months.  Mother states that her son keeps getting sent home and "need to be admitted" so his condition can be "figured out".  Mother states his PCP does not know the pt well, and does not know what is wrong.  CBG 187 by ems, 4 zofran given by ems with 300 ml NS through 22 gauge in left hand.  Pt reports weight loss of 40-45 lbs in past three months

## 2012-11-05 NOTE — ED Provider Notes (Signed)
Medical screening examination/treatment/procedure(s) were performed by non-physician practitioner and as supervising physician I was immediately available for consultation/collaboration.   Laray Anger, DO 11/05/12 [phone]

## 2012-11-06 ENCOUNTER — Telehealth: Payer: Self-pay | Admitting: Family Medicine

## 2012-11-06 NOTE — Telephone Encounter (Signed)
I went over details with the patient about Vincenza Hews tysinger PA-C message. The patient states that his mother would be home at 12 noon and he would talk with her and get back in touch with [place]. Saving message to the chart until patient calls back to schedule his follow up appointment if that is what he decides to do. CLS

## 2012-11-06 NOTE — Telephone Encounter (Signed)
I sent you different msg on him.    It is certainly his choice to see another doctor, but we have been very accomodating to him, have gotten his sugars under much better control, and I thought we had established a good patient/provider relationship.  His sleep issues are complex, and we are trying to work with him to get this improved, but to just give up on [place] doesn't seem right?      I would hope he would reconsider and return for evaluation to help get his issues under control, he can even see one of the other providers here for another opinion, but I don't agree with his decision to just switch offices altogether at this point.

## 2012-11-06 NOTE — Telephone Encounter (Signed)
Message copied by Janeice Robinson on Mon Nov 06, 2012  9:41 AM ------      Message from: Jac Canavan      Created: Mon Nov 06, 2012  8:06 AM       Needs appt.  Multiple recent ED visits, let figure this out, make referrals if needed.  ------

## 2012-11-06 NOTE — Telephone Encounter (Signed)
Patient states that he is going to change doctors and start seeing his mother's doctor. CLS

## 2012-11-06 NOTE — Telephone Encounter (Signed)
Patient was made aware that he will need a follow up visit scheduled here. Patient states that his mother will be home at 12 noon and he will speak with her and call us back. CLS

## 2012-11-14 ENCOUNTER — Emergency Department (HOSPITAL_COMMUNITY)
Admission: EM | Admit: 2012-11-14 | Discharge: 2012-11-14 | Disposition: A | Discharge status: Home / Self Care | Payer: BC Managed Care – PPO | Attending: Emergency Medicine | Admitting: Emergency Medicine

## 2012-11-14 ENCOUNTER — Encounter (HOSPITAL_COMMUNITY): Admission: RE | Admit: 2012-11-14 | Payer: BC Managed Care – PPO | Source: Ambulatory Visit

## 2012-11-14 ENCOUNTER — Encounter (HOSPITAL_COMMUNITY): Payer: Self-pay

## 2012-11-14 ENCOUNTER — Encounter (HOSPITAL_COMMUNITY): Payer: Self-pay | Admitting: Emergency Medicine

## 2012-11-14 DIAGNOSIS — G43909 Migraine, unspecified, not intractable, without status migrainosus: Secondary | ICD-10-CM | POA: Insufficient documentation

## 2012-11-14 DIAGNOSIS — Z79899 Other long term (current) drug therapy: Secondary | ICD-10-CM | POA: Insufficient documentation

## 2012-11-14 DIAGNOSIS — E860 Dehydration: Secondary | ICD-10-CM

## 2012-11-14 DIAGNOSIS — R197 Diarrhea, unspecified: Secondary | ICD-10-CM | POA: Insufficient documentation

## 2012-11-14 DIAGNOSIS — I959 Hypotension, unspecified: Secondary | ICD-10-CM | POA: Insufficient documentation

## 2012-11-14 DIAGNOSIS — G4700 Insomnia, unspecified: Secondary | ICD-10-CM | POA: Insufficient documentation

## 2012-11-14 DIAGNOSIS — Z872 Personal history of diseases of the skin and subcutaneous tissue: Secondary | ICD-10-CM | POA: Insufficient documentation

## 2012-11-14 DIAGNOSIS — E785 Hyperlipidemia, unspecified: Secondary | ICD-10-CM | POA: Insufficient documentation

## 2012-11-14 DIAGNOSIS — R42 Dizziness and giddiness: Secondary | ICD-10-CM | POA: Insufficient documentation

## 2012-11-14 DIAGNOSIS — E119 Type 2 diabetes mellitus without complications: Secondary | ICD-10-CM | POA: Insufficient documentation

## 2012-11-14 LAB — URINALYSIS, ROUTINE W REFLEX MICROSCOPIC
Ketones, ur: NEGATIVE mg/dL
Nitrite: NEGATIVE
Specific Gravity, Urine: 1.03 (ref 1.005–1.030)
Urobilinogen, UA: 0.2 mg/dL (ref 0.0–1.0)
pH: 6 (ref 5.0–8.0)

## 2012-11-14 LAB — URINE MICROSCOPIC-ADD ON

## 2012-11-14 LAB — COMPREHENSIVE METABOLIC PANEL
ALT: 63 U/L — ABNORMAL HIGH (ref 0–53)
Alkaline Phosphatase: 61 U/L (ref 39–117)
BUN: 14 mg/dL (ref 6–23)
CO2: 30 mEq/L (ref 19–32)
Calcium: 10 mg/dL (ref 8.4–10.5)
GFR calc Af Amer: >90 mL/min (ref >90)
GFR calc non Af Amer: >90 mL/min (ref >90)
Glucose, Bld: 240 mg/dL — ABNORMAL HIGH (ref 70–99)
Sodium: 134 mEq/L — ABNORMAL LOW (ref 135–145)
Total Protein: 7.8 g/dL (ref 6.0–8.3)

## 2012-11-14 LAB — CBC WITH DIFFERENTIAL/PLATELET
Basophils Relative: 0 % (ref 0–1)
Eosinophils Absolute: 0.1 10*3/uL (ref 0.0–0.7)
Eosinophils Relative: 2 % (ref 0–5)
HCT: 46.7 % (ref 39.0–52.0)
Hemoglobin: 16.3 g/dL (ref 13.0–17.0)
MCH: 29.5 pg (ref 26.0–34.0)
MCHC: 34.9 g/dL (ref 30.0–36.0)
MCV: 84.6 fL (ref 78.0–100.0)
Monocytes Absolute: 0.6 10*3/uL (ref 0.1–1.0)
Neutro Abs: 3.5 10*3/uL (ref 1.7–7.7)
RBC: 5.52 MIL/uL (ref 4.22–5.81)

## 2012-11-14 MED ORDER — SODIUM CHLORIDE 0.9 % IV BOLUS (SEPSIS)
1000.0000 mL | Freq: Once | INTRAVENOUS | Status: AC
  Administered 2012-11-14: 1000 mL via INTRAVENOUS

## 2012-11-14 NOTE — ED Notes (Signed)
Per patient, was hospitalized for weakness-woke up this am dizzy, not feeling well

## 2012-11-14 NOTE — ED Provider Notes (Signed)
History     CSN: [id number]  Arrival date & time 11/14/12  1140   First MD Initiated Contact with Patient 11/14/12 1142      Chief Complaint  Patient presents with  . hypotensive     (Consider location/radiation/quality/duration/timing/severity/associated sxs/prior treatment) HPI Comments: Patient comes to the ER for evaluation of generalized weakness with dizziness while standing. Patient reports that he saw his Dr. this morning and was found to be hypotensive. He was sent to the ER for further evaluation.  Patient reports that he had an episode of constipation recently, during which time he took multiple laxatives. Since then he has had ongoing diarrhea. He denies vomiting and has not had any abdominal pain.   Past Medical History  Diagnosis Date  . Hyperlipidemia   . Wears glasses   . Migraine   . Unspecified disorder of skin and subcutaneous tissue     recurrent summertime rash, large lesions scattered?  . Diabetes mellitus type II, uncontrolled     diagnosed age 42yo; week long hospitalization at diagnosis  . Microalbuminuria   . Insomnia     Past Surgical History  Procedure Laterality Date  . Vasectomy      Family History  Problem Relation Age of Onset  . Diabetes Maternal Aunt   . Diabetes Maternal Uncle   . Diabetes Maternal Grandmother   . Heart disease Neg Hx   . Stroke Neg Hx   . Cancer Neg Hx     History  Substance Use Topics  . Smoking status: Never Smoker   . Smokeless tobacco: Not on file  . Alcohol Use: Yes     Comment: occasionally      Review of Systems  Constitutional: Positive for fatigue. Negative for fever.  Gastrointestinal: Positive for diarrhea.  Neurological: Positive for dizziness and weakness. Negative for syncope.  All other systems reviewed and are negative.    Allergies  Review of patient's allergies indicates no known allergies.  Home Medications   Current Outpatient Rx  Name  Route  Sig  Dispense  Refill  .  atenolol (TENORMIN) 25 MG tablet   Oral   Take 25 mg by mouth daily.         Marland Kitchen atorvastatin (LIPITOR) 40 MG tablet   Oral   Take 40 mg by mouth daily.         Marland Kitchen lisinopril (PRINIVIL,ZESTRIL) 5 MG tablet   Oral   Take 1 tablet (42 mg total) by mouth daily.   30 tablet   5   . metFORMIN (GLUCOPHAGE) 1000 MG tablet   Oral   Take 1 tablet (1,000 mg total) by mouth 2 (two) times daily with a meal.   60 tablet   3   . Multiple Vitamin (MULTIVITAMIN WITH MINERALS) TABS   Oral   Take 1 tablet by mouth daily.         Marland Kitchen zolpidem (AMBIEN) 10 MG tablet   Oral   Take 10 mg by mouth at bedtime as needed for sleep. For sleep           BP 83/64  Pulse 87  Temp(Src) 97.9 F (36.6 C) (Oral)  SpO2 100%  Physical Exam  Constitutional: He is oriented to person, place, and time. He appears well-developed and well-nourished. No distress.  HENT:  Head: Normocephalic and atraumatic.  Right Ear: Hearing normal.  Nose: Nose normal.  Mouth/Throat: Oropharynx is clear and moist and mucous membranes are normal.  Eyes: Conjunctivae and EOM are  normal. Pupils are equal, round, and reactive to light.  Neck: Normal range of motion. Neck supple.  Cardiovascular: Normal rate, regular rhythm, S1 normal and S2 normal.  Exam reveals no gallop and no friction rub.   No murmur heard. Pulmonary/Chest: Effort normal and breath sounds normal. No respiratory distress. He exhibits no tenderness.  Abdominal: Soft. Normal appearance and bowel sounds are normal. There is no hepatosplenomegaly. There is no tenderness. There is no rebound, no guarding, no tenderness at McBurney's point and negative Murphy's sign. No hernia.  Musculoskeletal: Normal range of motion.  Neurological: He is alert and oriented to person, place, and time. He has normal strength. No cranial nerve deficit or sensory deficit. Coordination normal. GCS eye subscore is 4. GCS verbal subscore is 5. GCS motor subscore is 6.  Skin: Skin is  warm, dry and intact. No rash noted. No cyanosis.  Psychiatric: He has a normal mood and affect. His speech is normal and behavior is normal. Thought content normal.    ED Course  Procedures (including critical care time)  EKG:  Date: 11/14/2012  Rate: 85  Rhythm: normal sinus rhythm  QRS Axis: left  Intervals: normal  ST/T Wave abnormalities: normal  Conduction Disutrbances:left anterior fascicular block  Narrative Interpretation:   Old EKG Reviewed: unchanged   Labs Reviewed  CBC WITH DIFFERENTIAL - Abnormal; Notable for the following:    Platelets 451 (*)    All other components within normal limits  COMPREHENSIVE METABOLIC PANEL - Abnormal; Notable for the following:    Sodium 134 (*)    Chloride 95 (*)    Glucose, Bld 240 (*)    AST 50 (*)    ALT 63 (*)    All other components within normal limits  URINALYSIS, ROUTINE W REFLEX MICROSCOPIC - Abnormal; Notable for the following:    Color, Urine AMBER (*)    Glucose, UA 250 (*)    Bilirubin Urine SMALL (*)    Protein, ur 30 (*)    Leukocytes, UA SMALL (*)    All other components within normal limits  URINE MICROSCOPIC-ADD ON - Abnormal; Notable for the following:    Squamous Epithelial / LPF FEW (*)    Bacteria, UA FEW (*)    All other components within normal limits  CLOSTRIDIUM DIFFICILE BY PCR  URINE CULTURE   No results found.   Diagnosis: Dehydration    MDM  Patient presents to the ER for evaluation of dizziness with ambulation. Patient was at his primary doctor's office this morning and had a blood pressure in the 70s. Patient reports that lying flat he was fairly comfortable, but her stomach up in the ground he became very dizzy. No loss of consciousness. It was felt that this was due to his orthostasis. Patient was administered a liter of fluid his blood pressure is now improved. Last one was 109 systolic. He is ventilating without dizziness or symptoms. Blood work was otherwise unremarkable. Patient will  be discharged, continue oral hydration.        Gilda Crease, MD 11/14/12 [phone]

## 2012-11-15 ENCOUNTER — Encounter (HOSPITAL_COMMUNITY): Payer: BC Managed Care – PPO

## 2012-11-15 ENCOUNTER — Encounter (HOSPITAL_COMMUNITY): Payer: Self-pay

## 2012-11-15 LAB — URINE CULTURE

## 2012-11-26 ENCOUNTER — Emergency Department (HOSPITAL_COMMUNITY): Payer: BC Managed Care – PPO

## 2012-11-26 ENCOUNTER — Encounter (HOSPITAL_COMMUNITY): Payer: Self-pay | Admitting: *Deleted

## 2012-11-26 ENCOUNTER — Emergency Department (HOSPITAL_COMMUNITY)
Admission: EM | Admit: 2012-11-26 | Discharge: 2012-11-26 | Disposition: A | Discharge status: Home / Self Care | Payer: BC Managed Care – PPO | Attending: Emergency Medicine | Admitting: Emergency Medicine

## 2012-11-26 DIAGNOSIS — R109 Unspecified abdominal pain: Secondary | ICD-10-CM | POA: Insufficient documentation

## 2012-11-26 DIAGNOSIS — Z79899 Other long term (current) drug therapy: Secondary | ICD-10-CM | POA: Insufficient documentation

## 2012-11-26 DIAGNOSIS — R112 Nausea with vomiting, unspecified: Secondary | ICD-10-CM

## 2012-11-26 LAB — URINALYSIS, ROUTINE W REFLEX MICROSCOPIC
Bilirubin Urine: NEGATIVE
Glucose, UA: NEGATIVE mg/dL
Hgb urine dipstick: NEGATIVE
Ketones, ur: NEGATIVE mg/dL
Leukocytes, UA: NEGATIVE
Nitrite: NEGATIVE
Protein, ur: NEGATIVE mg/dL
Specific Gravity, Urine: 1.021 (ref 1.005–1.030)
Urobilinogen, UA: 1 mg/dL (ref 0.0–1.0)
pH: 6 (ref 5.0–8.0)

## 2012-11-26 LAB — COMPREHENSIVE METABOLIC PANEL
ALT: 18 U/L (ref 0–53)
AST: 19 U/L (ref 0–37)
Albumin: 3.8 g/dL (ref 3.5–5.2)
Alkaline Phosphatase: 54 U/L (ref 39–117)
BUN: 11 mg/dL (ref 6–23)
CO2: 28 mEq/L (ref 19–32)
Calcium: 9.8 mg/dL (ref 8.4–10.5)
Chloride: 96 mEq/L (ref 96–112)
Creatinine, Ser: 0.75 mg/dL (ref 0.50–1.35)
GFR calc Af Amer: >90 mL/min (ref >90)
GFR calc non Af Amer: >90 mL/min (ref >90)
Glucose, Bld: 135 mg/dL — ABNORMAL HIGH (ref 70–99)
Potassium: 3.9 mEq/L (ref 3.5–5.1)
Sodium: 133 mEq/L — ABNORMAL LOW (ref 135–145)
Total Bilirubin: 1 mg/dL (ref 0.3–1.2)
Total Protein: 7 g/dL (ref 6.0–8.3)

## 2012-11-26 LAB — CBC WITH DIFFERENTIAL/PLATELET
Basophils Absolute: 0 10*3/uL (ref 0.0–0.1)
Basophils Relative: 0 % (ref 0–1)
Eosinophils Absolute: 0.2 10*3/uL (ref 0.0–0.7)
Eosinophils Relative: 2 % (ref 0–5)
HCT: 41.5 % (ref 39.0–52.0)
Hemoglobin: 14.5 g/dL (ref 13.0–17.0)
Lymphocytes Relative: 42 % (ref 12–46)
Lymphs Abs: 2.8 10*3/uL (ref 0.7–4.0)
MCH: 29.1 pg (ref 26.0–34.0)
MCHC: 34.9 g/dL (ref 30.0–36.0)
MCV: 83.2 fL (ref 78.0–100.0)
Monocytes Absolute: 0.7 10*3/uL (ref 0.1–1.0)
Monocytes Relative: 10 % (ref 3–12)
Neutro Abs: 3 10*3/uL (ref 1.7–7.7)
Neutrophils Relative %: 45 % (ref 43–77)
Platelets: 425 10*3/uL — ABNORMAL HIGH (ref 150–400)
RBC: 4.99 MIL/uL (ref 4.22–5.81)
RDW: 12.6 % (ref 11.5–15.5)
WBC: 6.6 10*3/uL (ref 4.0–10.5)

## 2012-11-26 LAB — LIPASE, BLOOD: Lipase: 29 U/L (ref 11–59)

## 2012-11-26 MED ORDER — PROMETHAZINE HCL 25 MG PO TABS: 25.0000 mg | ORAL_TABLET | Freq: Four times a day (QID) | ORAL | Status: DC | PRN

## 2012-11-26 MED ORDER — METOCLOPRAMIDE HCL 5 MG/ML IJ SOLN
10.0000 mg | Freq: Once | INTRAMUSCULAR | Status: AC
  Administered 2012-11-26: 10 mg via INTRAVENOUS
  Filled 2012-11-26: qty 2

## 2012-11-26 MED ORDER — ONDANSETRON HCL 4 MG/2ML IJ SOLN
4.0000 mg | Freq: Once | INTRAMUSCULAR | Status: AC
  Administered 2012-11-26: 4 mg via INTRAVENOUS
  Filled 2012-11-26: qty 2

## 2012-11-26 MED ORDER — SODIUM CHLORIDE 0.9 % IV BOLUS (SEPSIS)
2000.0000 mL | Freq: Once | INTRAVENOUS | Status: AC
  Administered 2012-11-26: 2000 mL via INTRAVENOUS

## 2012-11-26 NOTE — ED Notes (Signed)
Pt presents to ed with c/o abdominal pain, nausea, vomiting, diarrhea x 1 week. Per family pt has kidney stones and UTI. Pt also reports thyroid problems: sts losing weight, not sleeping well has no appetite and has very low energy.

## 2012-11-26 NOTE — ED Provider Notes (Signed)
History     CSN: [id number]  Arrival date & time 11/26/12  1557   First MD Initiated Contact with Patient 11/26/12 1609      Chief Complaint  Patient presents with  . Nausea  . Emesis    (Consider location/radiation/quality/duration/timing/severity/associated sxs/prior treatment) HPI Patient presents to the emergency department with nausea, vomiting , with occasional, abdominal pain.  Patient's mother is giving me the history is the patient had taken an Ambien prior to arrival and was sleepy.  The patient has had a myriad of symptoms.  That have been ongoing for several months, and has been worked up multiple times here in the emergency department along with his primary Dr. patient has recently been treated for UTI and kidney stone.  Mother states the patient has not had chest pain, shortness of breath, fever, cough, bloody stool, or syncope.  Past Surgical History  Procedure Laterality Date  . Vasectomy      Family History  Problem Relation Age of Onset  . Diabetes Maternal Aunt   . Diabetes Maternal Uncle   . Diabetes Maternal Grandmother   . Heart disease Neg Hx   . Stroke Neg Hx   . Cancer Neg Hx     History  Substance Use Topics  . Smoking status: Never Smoker   . Smokeless tobacco: Not on file  . Alcohol Use: Yes     Comment: occasionally      Review of Systems All other systems negative except as documented in the HPI. All pertinent positives and negatives as reviewed in the HPI. Allergies  Review of patient's allergies indicates no known allergies.  Home Medications   Current Outpatient Rx  Name  Route  Sig  Dispense  Refill  . atorvastatin (LIPITOR) 40 MG tablet   Oral   Take 40 mg by mouth daily.         Marland Kitchen lisinopril (PRINIVIL,ZESTRIL) 5 MG tablet   Oral   Take 1 tablet (5 mg total) by mouth daily.   30 tablet   5   . magnesium citrate SOLN   Oral   Take 1 Bottle by mouth once.         . magnesium hydroxide (PHILLIPS CHEWS) 311 MG  CHEW   Oral   Chew 311 mg by mouth every 4 (four) hours as needed (for constipation.).         Marland Kitchen metFORMIN (GLUCOPHAGE) 1000 MG tablet   Oral   Take 1 tablet (1,000 mg total) by mouth 2 (two) times daily with a meal.   60 tablet   3   . Multiple Vitamin (MULTIVITAMIN WITH MINERALS) TABS   Oral   Take 1 tablet by mouth daily.         . psyllium (HYDROCIL/METAMUCIL) 95 % PACK   Oral   Take 1 packet by mouth once.         Marland Kitchen zolpidem (AMBIEN) 10 MG tablet   Oral   Take 10 mg by mouth at bedtime as needed for sleep.          Marland Kitchen atenolol (TENORMIN) 25 MG tablet   Oral   Take 25 mg by mouth daily.           BP 115/81  Pulse 109  Temp(Src) 98.6 F (37 C) (Oral)  Resp 16  SpO2 100%  Physical Exam  Nursing note and vitals reviewed. Constitutional: He appears well-developed and well-nourished. No distress.  HENT:  Head: Normocephalic and atraumatic.  Mouth/Throat: Oropharynx  is clear and moist.  Eyes: Pupils are equal, round, and reactive to light.  Neck: Normal range of motion. Neck supple.  Cardiovascular: Normal rate, regular rhythm and normal heart sounds.  Exam reveals no gallop and no friction rub.   No murmur heard. Pulmonary/Chest: Effort normal and breath sounds normal. No respiratory distress.  Abdominal: Soft. Bowel sounds are normal. He exhibits no distension. There is no tenderness. There is no rebound and no guarding.  Neurological: He is alert.  Skin: Skin is warm and dry. No rash noted.    ED Course  Procedures (including critical care time)  Labs Reviewed  CBC WITH DIFFERENTIAL - Abnormal; Notable for the following:    Platelets 425 (*)    All other components within normal limits  COMPREHENSIVE METABOLIC PANEL - Abnormal; Notable for the following:    Sodium 133 (*)    Glucose, Bld 135 (*)    All other components within normal limits  URINALYSIS, ROUTINE W REFLEX MICROSCOPIC  LIPASE, BLOOD   Ct Abdomen Pelvis Wo Contrast  11/26/2012   *RADIOLOGY REPORT*  Clinical Data: Abdominal pain with nausea, vomiting, and diarrhea. History of kidney stones.  CT ABDOMEN AND PELVIS WITHOUT CONTRAST  Technique:  Multidetector CT imaging of the abdomen and pelvis was performed following the standard protocol without intravenous contrast.  Comparison: None.  Findings: Lung bases are clear.  Liver, spleen, pancreas, biliary tree, adrenal glands, and abdominal aorta are normal.  1.5 mm stone in the lower pole of the right kidney.  2.5 mm stones in the midportion of the left kidney.  There are solitary low-density lesions in  both kidneys measuring approximately 11 mm.  These are most likely cysts but are nonspecific on this noncontrast study.  The bowel appears normal including the terminal ileum and appendix. There are no ureteral calculi.  No hydronephrosis.  Multiple phleboliths in the pelvis. No acute osseous abnormality.  IMPRESSION: No acute abnormality of the abdomen or pelvis.  Tiny stones in both kidneys.  Small low-density lesions in both kidneys, nonspecific but statistically most likely cysts.   Original Report Authenticated By: Francene Boyers, M.D.     Evaluated here in the emergency department has been stable.  Patient is referred back to his primary care Dr. told to return here as needed.  For any worsening in his condition.  Patient has tolerated oral fluids and food   MDM          Carlyle Dolly, PA-C 11/27/12 0209

## 2012-11-26 NOTE — ED Notes (Signed)
Pt tolerated  Orange juice. Asked for [place] sandwich.

## 2012-11-26 NOTE — ED Notes (Signed)
Prior to bringing sandwich and water, pt family came out and stated that patient was nauseated.

## 2012-11-28 NOTE — ED Provider Notes (Signed)
Medical screening examination/treatment/procedure(s) were performed by non-physician practitioner and as supervising physician I was immediately available for consultation/collaboration.   Rockell Faulks, MD 11/28/12 1506 

## 2012-11-30 ENCOUNTER — Telehealth: Payer: Self-pay | Admitting: Internal Medicine

## 2012-11-30 NOTE — Telephone Encounter (Signed)
Faxed medical records to [facility] family [facility] @ [place] @ [phone]

## 2012-12-03 ENCOUNTER — Encounter (HOSPITAL_COMMUNITY): Payer: Self-pay | Admitting: *Deleted

## 2012-12-03 ENCOUNTER — Emergency Department (HOSPITAL_COMMUNITY)
Admission: EM | Admit: 2012-12-03 | Discharge: 2012-12-03 | Disposition: A | Discharge status: Home / Self Care | Payer: BC Managed Care – PPO | Attending: Emergency Medicine | Admitting: Emergency Medicine

## 2012-12-03 DIAGNOSIS — G4700 Insomnia, unspecified: Secondary | ICD-10-CM | POA: Insufficient documentation

## 2012-12-03 DIAGNOSIS — Z872 Personal history of diseases of the skin and subcutaneous tissue: Secondary | ICD-10-CM | POA: Insufficient documentation

## 2012-12-03 DIAGNOSIS — E785 Hyperlipidemia, unspecified: Secondary | ICD-10-CM | POA: Insufficient documentation

## 2012-12-03 DIAGNOSIS — N200 Calculus of kidney: Secondary | ICD-10-CM | POA: Insufficient documentation

## 2012-12-03 DIAGNOSIS — G43909 Migraine, unspecified, not intractable, without status migrainosus: Secondary | ICD-10-CM | POA: Insufficient documentation

## 2012-12-03 DIAGNOSIS — Z79899 Other long term (current) drug therapy: Secondary | ICD-10-CM | POA: Insufficient documentation

## 2012-12-03 DIAGNOSIS — R112 Nausea with vomiting, unspecified: Secondary | ICD-10-CM | POA: Insufficient documentation

## 2012-12-03 DIAGNOSIS — E119 Type 2 diabetes mellitus without complications: Secondary | ICD-10-CM | POA: Insufficient documentation

## 2012-12-03 LAB — COMPREHENSIVE METABOLIC PANEL
BUN: 12 mg/dL (ref 6–23)
CO2: 24 mEq/L (ref 19–32)
Calcium: 10.2 mg/dL (ref 8.4–10.5)
Chloride: 94 mEq/L — ABNORMAL LOW (ref 96–112)
Creatinine, Ser: 0.83 mg/dL (ref 0.50–1.35)
GFR calc Af Amer: >90 mL/min (ref >90)
GFR calc non Af Amer: >90 mL/min (ref >90)
Glucose, Bld: 173 mg/dL — ABNORMAL HIGH (ref 70–99)
Total Bilirubin: 1.2 mg/dL (ref 0.3–1.2)

## 2012-12-03 LAB — CBC WITH DIFFERENTIAL/PLATELET
Eosinophils Relative: 1 % (ref 0–5)
HCT: 45.6 % (ref 39.0–52.0)
Hemoglobin: 15.9 g/dL (ref 13.0–17.0)
Lymphocytes Relative: 20 % (ref 12–46)
MCHC: 34.9 g/dL (ref 30.0–36.0)
MCV: 83.5 fL (ref 78.0–100.0)
Monocytes Absolute: 0.7 10*3/uL (ref 0.1–1.0)
Monocytes Relative: 8 % (ref 3–12)
Neutro Abs: 6.2 10*3/uL (ref 1.7–7.7)
WBC: 8.7 10*3/uL (ref 4.0–10.5)

## 2012-12-03 LAB — URINALYSIS, ROUTINE W REFLEX MICROSCOPIC
Glucose, UA: NEGATIVE mg/dL
Ketones, ur: NEGATIVE mg/dL
Leukocytes, UA: NEGATIVE
Nitrite: NEGATIVE
Protein, ur: 30 mg/dL — AB
Urobilinogen, UA: 1 mg/dL (ref 0.0–1.0)

## 2012-12-03 LAB — TROPONIN I: Troponin I: <0.3 ng/mL (ref <0.30)

## 2012-12-03 LAB — URINE MICROSCOPIC-ADD ON

## 2012-12-03 LAB — LIPASE, BLOOD: Lipase: 25 U/L (ref 11–59)

## 2012-12-03 MED ORDER — ONDANSETRON HCL 4 MG/2ML IJ SOLN
4.0000 mg | Freq: Once | INTRAMUSCULAR | Status: AC
  Administered 2012-12-03: 4 mg via INTRAVENOUS
  Filled 2012-12-03: qty 2

## 2012-12-03 MED ORDER — ONDANSETRON HCL 4 MG PO TABS: 4.0000 mg | ORAL_TABLET | Freq: Four times a day (QID) | ORAL | Status: DC

## 2012-12-03 MED ORDER — SODIUM CHLORIDE 0.9 % IV BOLUS (SEPSIS)
1000.0000 mL | Freq: Once | INTRAVENOUS | Status: AC
  Administered 2012-12-03: 1000 mL via INTRAVENOUS

## 2012-12-03 NOTE — ED Notes (Signed)
PA at bedside.

## 2012-12-03 NOTE — ED Provider Notes (Signed)
History     CSN: [id number]  Arrival date & time 12/03/12  0804   First MD Initiated Contact with Patient 12/03/12 [phone]      No chief complaint on file.   (Consider location/radiation/quality/duration/timing/severity/associated sxs/prior treatment) HPI  42 year old male with history of diabetes, hyperlipidemia, a migraine presents complaining of nausea vomiting. Patient reports for the past 5-6 months he has had intermittent nausea and vomiting. States he feels nauseous on a daily basis and his symptoms worsen after eating. He usually vomits at least once a day vomitus is nonbloody nonbilious. For the past several days his symptoms worsen despite taking antinausea medication. He feels dehydrated unable to keep anything down. He denies any diarrhea, last bowel movement was this morning. Patient denies fever, chills, headache, lightheadedness, dizziness, chest pain, shortness of breath, dysuria, or rash. He has history of diabetes but states that is currently controlled. He checks his blood sugar once daily and it usually runs in the 160. Remember report patient has recently followup with his endocrinologist and was diagnosed with having bacteria, however unsure whether bacteria is from. He was given amoxicillin, clarithromycin, and PPI as treatment. He started to take antibiotic on Friday and ended up vomiting and unable to keep the medication down. This is patient's fourth ER visits less than a month for same complaint. Patient has an abdominal and pelvic CT scan without contrast done a week ago that shows tiny stones in both kidneys without any significant pathology. Patient denies dysuria, hematuria, or pain with urination.  Past Medical History  Diagnosis Date  . Hyperlipidemia   . Wears glasses   . Migraine   . Unspecified disorder of skin and subcutaneous tissue     recurrent summertime rash, large lesions scattered?  . Diabetes mellitus type II, uncontrolled     diagnosed age 66yo;  week long hospitalization at diagnosis  . Microalbuminuria   . Insomnia     Past Surgical History  Procedure Laterality Date  . Vasectomy      Family History  Problem Relation Age of Onset  . Diabetes Maternal Aunt   . Diabetes Maternal Uncle   . Diabetes Maternal Grandmother   . Heart disease Neg Hx   . Stroke Neg Hx   . Cancer Neg Hx     History  Substance Use Topics  . Smoking status: Never Smoker   . Smokeless tobacco: Not on file  . Alcohol Use: Yes     Comment: occasionally      Review of Systems  Constitutional:       A complete 10 system review of systems was obtained and all systems are negative except as noted in the HPI and PMH.    Allergies  Review of patient's allergies indicates no known allergies.  Home Medications   Current Outpatient Rx  Name  Route  Sig  Dispense  Refill  . atenolol (TENORMIN) 25 MG tablet   Oral   Take 25 mg by mouth daily.         Marland Kitchen atorvastatin (LIPITOR) 40 MG tablet   Oral   Take 40 mg by mouth daily.         Marland Kitchen lisinopril (PRINIVIL,ZESTRIL) 5 MG tablet   Oral   Take 1 tablet (5 mg total) by mouth daily.   30 tablet   5   . magnesium citrate SOLN   Oral   Take 1 Bottle by mouth once.         . magnesium hydroxide (PHILLIPS  CHEWS) 311 MG CHEW   Oral   Chew 311 mg by mouth every 4 (four) hours as needed (for constipation.).         Marland Kitchen metFORMIN (GLUCOPHAGE) 1000 MG tablet   Oral   Take 1 tablet (1,000 mg total) by mouth 2 (two) times daily with a meal.   60 tablet   3   . Multiple Vitamin (MULTIVITAMIN WITH MINERALS) TABS   Oral   Take 1 tablet by mouth daily.         . promethazine (PHENERGAN) 25 MG tablet   Oral   Take 1 tablet (25 mg total) by mouth every 6 (six) hours as needed for nausea.   10 tablet   0   . psyllium (HYDROCIL/METAMUCIL) 95 % PACK   Oral   Take 1 packet by mouth once.         Marland Kitchen EXPIRED: zolpidem (AMBIEN) 10 MG tablet   Oral   Take 10 mg by mouth at bedtime as  needed for sleep.            BP 112/84  Pulse 90  Temp(Src) 98.4 F (36.9 C) (Oral)  Resp 16  Ht 5' 10.5" (1.791 m)  Wt 165 lb (74.844 kg)  BMI 23.33 kg/m2  SpO2 100%  Physical Exam  Nursing note and vitals reviewed. Constitutional: He is oriented to person, place, and time. He appears well-developed and well-nourished. No distress (Patient appears tired but in no acute distress, nontoxic).  HENT:  Head: Normocephalic and atraumatic.  Lips dry but oral mucosa moist  Eyes: Conjunctivae are normal.  Neck: Normal range of motion. Neck supple.  Cardiovascular: Normal rate and regular rhythm.   Pulmonary/Chest: Effort normal and breath sounds normal. No respiratory distress. He has no wheezes.  Abdominal: Soft. There is no tenderness. There is no rebound and no guarding.  Negative Murphy's sign, no McBurney's point, no hernia noted  Genitourinary:  No CVA tenderness  Musculoskeletal: Normal range of motion. He exhibits no edema and no tenderness.  5/5 strengths to all 4 extremities, intact distal pulses  Neurological: He is alert and oriented to person, place, and time.  Skin: Skin is warm. No rash noted.  Psychiatric: He has a normal mood and affect.    ED Course  Procedures (including critical care time)   Date: 12/03/2012  Rate: 102  Rhythm: sinus tachycardia  QRS Axis: left  Intervals: normal  ST/T Wave abnormalities: normal  Conduction Disutrbances:left anterior fascicular block  Narrative Interpretation:   Old EKG Reviewed: unchanged    8:35 AM Patient presents with persistent nausea and vomiting. This appears to be a chronic complaint as patient has been seen 4 times within one month. Prior labs are unremarkable. He has a nonsurgical abdomen. Is afebrile with stable normal vital sign. He was getting medications suggesting treatment for PUD, however unable to tolerates due to nausea and vomiting. Will check labs, IV hydration, and we'll continue to monitor.  Questionable gastroparesis due to history of diabetes. Abdomen nontender on exam, no CVA tenderness.   10:22 AM Mild spike in K+ at 5.4.  ECG shows no significant changes.  Trop I ordered.  Care discussed with attending.    10:57 AM Patient felt better. Able to tolerates by mouth. Normal troponin. At this time I think patient would best benefit by followup with his primary care Dr. for further management of his chronic condition. Return precautions discussed.  All questions answered to pt and family member's satisfaction.  Labs Reviewed  CBC WITH DIFFERENTIAL - Abnormal; Notable for the following:    Platelets 415 (*)    All other components within normal limits  COMPREHENSIVE METABOLIC PANEL - Abnormal; Notable for the following:    Sodium 129 (*)    Potassium 5.4 (*)    Chloride 94 (*)    Glucose, Bld 173 (*)    All other components within normal limits  URINALYSIS, ROUTINE W REFLEX MICROSCOPIC - Abnormal; Notable for the following:    Protein, ur 30 (*)    All other components within normal limits  GLUCOSE, CAPILLARY - Abnormal; Notable for the following:    Glucose-Capillary 172 (*)    All other components within normal limits  URINE MICROSCOPIC-ADD ON - Abnormal; Notable for the following:    Crystals CA OXALATE CRYSTALS (*)    All other components within normal limits  LIPASE, BLOOD  TROPONIN I   No results found.   1. Nausea & vomiting       MDM  BP 129/90  Pulse 105  Temp(Src) 98.4 F (36.9 C) (Oral)  Resp 14  Ht 5' 10.5" (1.791 m)  Wt 165 lb (74.844 kg)  BMI 23.33 kg/m2  SpO2 100%  I have reviewed nursing notes and vital signs. I personally reviewed the imaging tests through PACS system  I reviewed available ER/hospitalization records thought the EMR         Fayrene Helper, [place] 12/03/12 1111

## 2012-12-03 NOTE — ED Notes (Signed)
Pt reports hx of kidney stones. Abdominal pain since last night 8/10. Vomiting multiple times since yesterday. C/o of constipation as well.

## 2012-12-03 NOTE — ED Notes (Signed)
PA at bedside Pt alert and oriented x4. Respirations even and unlabored, bilateral symmetrical rise and fall of chest. Skin warm and dry. In no acute distress. Denies needs.   

## 2012-12-03 NOTE — ED Notes (Signed)
Pt escorted to discharge window. Pt verbalized understanding discharge instructions. In no acute distress.  

## 2012-12-08 NOTE — ED Provider Notes (Signed)
Medical screening examination/treatment/procedure(s) were performed by non-physician practitioner and as supervising physician I was immediately available for consultation/collaboration.  Hurman Horn, MD 12/08/12 [phone]

## 2013-01-15 ENCOUNTER — Encounter: Payer: Self-pay | Admitting: Neurology

## 2013-01-15 ENCOUNTER — Ambulatory Visit (INDEPENDENT_AMBULATORY_CARE_PROVIDER_SITE_OTHER): Payer: BC Managed Care – PPO | Admitting: Neurology

## 2013-01-15 VITALS — BP 106/81 | HR 99 | Temp 97.6°F | Ht 69.5 in | Wt 160.0 lb

## 2013-01-15 DIAGNOSIS — E1142 Type 2 diabetes mellitus with diabetic polyneuropathy: Secondary | ICD-10-CM

## 2013-01-15 DIAGNOSIS — G609 Hereditary and idiopathic neuropathy, unspecified: Secondary | ICD-10-CM

## 2013-01-15 DIAGNOSIS — G2581 Restless legs syndrome: Secondary | ICD-10-CM

## 2013-01-15 DIAGNOSIS — E1149 Type 2 diabetes mellitus with other diabetic neurological complication: Secondary | ICD-10-CM

## 2013-01-15 DIAGNOSIS — E1140 Type 2 diabetes mellitus with diabetic neuropathy, unspecified: Secondary | ICD-10-CM

## 2013-01-15 MED ORDER — GABAPENTIN 300 MG PO CAPS: 300.0000 mg | ORAL_CAPSULE | Freq: Every day | ORAL | Status: DC

## 2013-01-15 NOTE — Patient Instructions (Addendum)
I think overall you are doing fairly well but I do want to suggest a few things today:  Remember to drink plenty of fluid, eat healthy meals and do not skip any meals. Try to eat protein with a every meal and eat a healthy snack such as fruit or nuts in between meals. Try to keep a regular sleep-wake schedule and try to exercise daily, particularly in the form of walking, 20-30 minutes a day, if you can.   As far as your medications are concerned, I would like to suggest gabapentin 300 mg: Take nightly for 2 weeks, then twice daily for 2 weeks, then 3 times a day thereafter.   As far as diagnostic testing: EMG, NCV, blood work.  I would like to see you back in 3 months, sooner if we need to. Please call us with any interim questions, concerns, problems, updates or refill requests.  Please also call us for any test results so we can go over those with you on the phone. Brett Canales is my clinical assistant and will answer any of your questions and relay your messages to me and also relay most of my messages to you.  Our phone number is [phone]. We also have an after hours call service for urgent matters and there is a physician on-call for urgent questions. For any emergencies you know to call 911 or go to the nearest emergency room.

## 2013-01-15 NOTE — Progress Notes (Signed)
Subjective:    Patient ID: [name] [name] is a 42 y.o. male.  HPI  Huston Foley, MD, PhD [facility] [address] [address] [place], [place] [address]  Dear Dr.  Kateri Plummer,  I saw your patient, [name] [name], upon your kind request in my neurologic clinic today for initial consultation of his bilateral foot pain, concern for neuropathy. The patient is accompanied by his mother today. As you know, [name] is a very pleasant 42 year old right-handed gentleman with an underlying medical history of diabetes, insomnia and kidney stones who has had at times poorly controlled blood sugar values and has a long-standing history of diabetes. He has been experiencing severe foot pain with a burning sensation and numbness in his distal lower extremities in the past 2 to 3 months. He was given oxycodone, but ran out. He has not tried Lyrica, Cymbalta or neurontin. He was seen in the ER a month ago for severe N/V and had blood work, which I reviewed. In the past 6 months, he has had intermittent N/V and has had about 60 lb of wt loss. His Sx are worse at night and it helps to rub his legs. He is not sure if he kicks in his sleep.   His Past Medical History Is Significant For: Past Medical History  Diagnosis Date  . Hyperlipidemia   . Wears glasses   . Migraine   . Unspecified disorder of skin and subcutaneous tissue     recurrent summertime rash, large lesions scattered?  . Diabetes mellitus type II, uncontrolled     diagnosed age 28yo; week long hospitalization at diagnosis  . Microalbuminuria   . Insomnia     His Past Surgical History Is Significant For: Past Surgical History  Procedure Laterality Date  . Vasectomy      His Family History Is Significant For: Family History  Problem Relation Age of Onset  . Diabetes Maternal Aunt   . Diabetes Maternal Uncle   . Diabetes Maternal Grandmother   . Heart disease Neg Hx   . Stroke Neg Hx   . Cancer Neg Hx      His Social History Is Significant For: History   Social History  . Marital Status: Married    Spouse Name: N/A    Number of Children: N/A  . Years of Education: N/A   Social History Main Topics  . Smoking status: Never Smoker   . Smokeless tobacco: None  . Alcohol Use: Yes     Comment: occasionally  . Drug Use: No  . Sexually Active: None   Other Topics Concern  . None   Social History Narrative   Divorced, 2 children 14yo, 16yo, lives with brother and daughter, is an Event organiser for [employer]    His Allergies Are:  No Known Allergies:   His Current Medications Are:  Outpatient Encounter Prescriptions as of 01/15/2013  Medication Sig Dispense Refill  . insulin glargine (LANTUS) 100 UNIT/ML injection Inject 20 Units into the skin at bedtime.      Marland Kitchen oxyCODONE-acetaminophen (PERCOCET) 10-325 MG per tablet Take 1 tablet by mouth every 4 (four) hours as needed for pain.      Marland Kitchen amoxicillin (AMOXIL) 500 MG capsule Take 1,000 mg by mouth 2 (two) times daily.      . clarithromycin (BIAXIN) 500 MG tablet Take 500 mg by mouth 2 (two) times daily. x10 days      . gabapentin (NEURONTIN) 300 MG capsule Take 1 capsule (  300 mg total) by mouth at bedtime. Take nightly for 2 weeks, then twice daily for 2 weeks, then 3 times a day thereafter.  90 capsule  3  . lansoprazole (PREVACID) 30 MG capsule Take 30 mg by mouth 2 (two) times daily.      . metFORMIN (GLUCOPHAGE) 1000 MG tablet Take 1 tablet (1,000 mg total) by mouth 2 (two) times daily with a meal.  60 tablet  3  . Multiple Vitamin (MULTIVITAMIN WITH MINERALS) TABS Take 1 tablet by mouth daily.      Marland Kitchen omeprazole (PRILOSEC) 20 MG capsule Take 20 mg by mouth 2 (two) times daily.      . ondansetron (ZOFRAN) 4 MG tablet Take 1 tablet (4 mg total) by mouth every 6 (six) hours.  12 tablet  0  . Probiotic Product (PROBIOTIC DAILY PO) Take by mouth.      . promethazine (PHENERGAN) 25 MG tablet Take 1 tablet (25 mg total) by mouth  every 6 (six) hours as needed for nausea.  10 tablet  0  . zolpidem (AMBIEN) 10 MG tablet Take 10 mg by mouth at bedtime as needed for sleep.        No facility-administered encounter medications on file as of 01/15/2013.   Review of Systems  Constitutional: Positive for fatigue.  Gastrointestinal: Positive for constipation.  Neurological: Positive for numbness (Feet).       Pain in both feet    Objective:  Neurologic Exam  Physical Exam Physical Examination:   Filed Vitals:   01/15/13 1405  BP: 106/81  Pulse: 99  Temp: 97.6 F (36.4 C)    General Examination: The patient is a very pleasant 42 y.o. male in no acute distress. He appears well-developed and well-nourished and well groomed.   HEENT: Normocephalic, atraumatic, pupils are equal, round and reactive to light and accommodation. Funduscopic exam is normal with sharp disc margins noted. Extraocular tracking is good without limitation to gaze excursion or nystagmus noted. Normal smooth pursuit is noted. Hearing is grossly intact. Tympanic membranes are clear bilaterally. Face is symmetric with normal facial animation and normal facial sensation. Speech is clear with no dysarthria noted. There is no hypophonia. There is no lip, neck/head, jaw or voice tremor. Neck is supple with full range of passive and active motion. There are no carotid bruits on auscultation. Oropharynx exam reveals: mild mouth dryness, good dental hygiene and mild airway crowding, due to elongated uvula. Mallampati is class I. Tongue protrudes centrally and palate elevates symmetrically.   Chest: Clear to auscultation without wheezing, rhonchi or crackles noted.  Heart: S1+S2+0, regular and normal without murmurs, rubs or gallops noted.   Abdomen: Soft, non-tender and non-distended with normal bowel sounds appreciated on auscultation.  Extremities: There is no pitting edema in the distal lower extremities bilaterally. Pedal pulses are intact.  Skin: Warm  and dry without trophic changes noted. There are no varicose veins.  Musculoskeletal: exam reveals no obvious joint deformities, tenderness or joint swelling or erythema.   Neurologically:  Mental status: The patient is awake, alert and oriented in all 4 spheres. His memory, attention, language and knowledge are appropriate. There is no aphasia, agnosia, apraxia or anomia. Speech is clear with normal prosody and enunciation. Thought process is linear. Mood is congruent and affect is blunted.  Cranial nerves are as described above under HEENT exam. In addition, shoulder shrug is normal with equal shoulder height noted. Motor exam: Normal bulk, strength and tone is noted. There is no  drift, tremor or rebound. Romberg is negative. Reflexes are 2+ throughout. Toes are downgoing bilaterally. Fine motor skills are intact with normal finger taps, normal hand movements, normal rapid alternating patting, normal foot taps and normal foot agility.  Cerebellar testing shows no dysmetria or intention tremor on finger to nose testing. Heel to shin is unremarkable bilaterally. There is no truncal or gait ataxia.  Sensory exam is intact to light touch, pinprick, vibration, temperature sense and proprioception in the upper extremities with mild decrease to vibration and temperature sense in the LEs, R>L distally, in the feet.  Gait, station and balance are unremarkable. No veering to one side is noted. No leaning to one side is noted. Posture is age-appropriate and stance is narrow based. No problems turning are noted. He turns en bloc. He complains of burning sensation while walkingTandem walk is unremarkable. Intact toe and heel stance is noted.               Assessment and Plan:   Assessment and Plan:  In summary, [name] T Reichart is a very pleasant 42 y.o.-year old male with a long-standing history of diabetes with poor control in the past who has been experiencing for the past 2-3 months severe burning pain and  tingling as well as numbness in his feet primarily in the soles of his feet. His history and physical exam are most consistent with diabetic neuropathy. I would like to proceed with EMG nerve conduction studies, further blood work and a trial of gabapentin. He is encouraged to start at 300 mg strength one at night for 2 weeks then increase it to twice daily after that and further to 3 times a day after 2 more weeks. He is advised about potential side effects of this medication. We will call him with his test results far as blood work and an electrodiagnostic testing is concerned. He was in agreement. I answered their questions today and would like see him back in 3 months from now, sooner if the need arises and encouraged them to call with any interim questions, concerns, problems or updates. Thank you very much for allowing me to participate in the care of this nice patient. If I can be of any further assistance to you please do not hesitate to call me at [phone].  Sincerely,   Huston Foley, MD, PhD

## 2013-01-16 ENCOUNTER — Encounter (HOSPITAL_COMMUNITY): Payer: BC Managed Care – PPO

## 2013-01-16 LAB — SEDIMENTATION RATE: Sed Rate: 2 mm/hr (ref 0–15)

## 2013-01-16 LAB — VITAMIN D 25 HYDROXY (VIT D DEFICIENCY, FRACTURES): Vit D, 25-Hydroxy: 29.2 ng/mL — ABNORMAL LOW (ref 30.0–100.0)

## 2013-01-16 LAB — RPR: RPR: NONREACTIVE

## 2013-01-16 LAB — B12 AND FOLATE PANEL
Folate: 11.3 ng/mL (ref >3.0)
Vitamin B-12: 435 pg/mL (ref 211–946)

## 2013-01-16 LAB — HIV ANTIBODY (ROUTINE TESTING W REFLEX)
HIV 1/O/2 Abs-Index Value: <1 (ref <1.00)
HIV-1/HIV-2 Ab: NONREACTIVE

## 2013-01-16 NOTE — Progress Notes (Signed)
Quick Note:  Please call and advise the patient that the recent labs we checked were within normal limits. The exception was a borderline low vitamin D level for which she can start taking an over-the-counter vitamin D supplement. No other action is required on these tests at this time. Please remind patient to keep any upcoming appointments or tests and to call us with any interim questions, concerns, problems or updates. Thanks,  Huston Foley, MD, PhD    ______

## 2013-01-16 NOTE — Progress Notes (Signed)
Quick Note:  I called and spoke with the patient concerning his lab results being normal, except for his vitamin D level being borderline low. I informed the patient per Dr. Teofilo Pod note to take an over-the-counter vitamin D supplement. ______

## 2013-01-17 ENCOUNTER — Encounter (HOSPITAL_COMMUNITY): Payer: BC Managed Care – PPO

## 2013-01-22 ENCOUNTER — Encounter: Payer: BC Managed Care – PPO | Admitting: Neurology

## 2013-01-23 ENCOUNTER — Encounter (INDEPENDENT_AMBULATORY_CARE_PROVIDER_SITE_OTHER): Payer: BC Managed Care – PPO

## 2013-01-23 ENCOUNTER — Ambulatory Visit (INDEPENDENT_AMBULATORY_CARE_PROVIDER_SITE_OTHER): Payer: BC Managed Care – PPO | Admitting: Neurology

## 2013-01-23 DIAGNOSIS — E1140 Type 2 diabetes mellitus with diabetic neuropathy, unspecified: Secondary | ICD-10-CM

## 2013-01-23 DIAGNOSIS — G609 Hereditary and idiopathic neuropathy, unspecified: Secondary | ICD-10-CM

## 2013-01-23 DIAGNOSIS — Z0289 Encounter for other administrative examinations: Secondary | ICD-10-CM

## 2013-01-23 DIAGNOSIS — M79609 Pain in unspecified limb: Secondary | ICD-10-CM

## 2013-01-23 MED ORDER — GABAPENTIN 300 MG PO CAPS: ORAL_CAPSULE | ORAL | Status: DC

## 2013-01-23 NOTE — Procedures (Signed)
  HISTORY:  [name] [name] is a 42 year old gentleman with a history of a 70 pound weight loss over 3 or 4 months. During this period time, he developed a burning dysesthesia issue with both feet. The patient is being evaluated for a possible peripheral neuropathy. The patient is currently being treated with gabapentin for the neuropathy pain.  NERVE CONDUCTION STUDIES:  Nerve conduction studies were performed on both lower extremities. The distal motor latencies and motor amplitudes for the peroneal and posterior tibial nerves were within normal limits. The nerve conduction velocities for these nerves were also normal. The H reflex latencies were normal. The sensory latencies for the peroneal nerves were within normal limits.    EMG STUDIES:  EMG study was performed on the right lower extremity:  The tibialis anterior muscle reveals 2 to 4K motor units with full recruitment. No fibrillations or positive waves were seen. The peroneus tertius muscle reveals 2 to 4K motor units with full recruitment. No fibrillations or positive waves were seen. The medial gastrocnemius muscle reveals 1 to 3K motor units with full recruitment. No fibrillations or positive waves were seen. The vastus lateralis muscle reveals 2 to 4K motor units with full recruitment. No fibrillations or positive waves were seen. The iliopsoas muscle reveals 2 to 4K motor units with full recruitment. No fibrillations or positive waves were seen. The biceps femoris muscle (long head) reveals 2 to 4K motor units with full recruitment. No fibrillations or positive waves were seen. The lumbosacral paraspinal muscles were tested at 3 levels, and revealed no abnormalities of insertional activity at all 3 levels tested. There was good relaxation.  EMG study was performed on the left lower extremity:  The tibialis anterior muscle reveals 2 to 5K motor units with decreased recruitment. 1 plus fibrillations and positive waves were seen. The  peroneus tertius muscle reveals 2 to 5K motor units with decreased recruitment. 1 plus fibrillations and positive waves were seen. The medial gastrocnemius muscle reveals 1 to 3K motor units with full recruitment. No fibrillations or positive waves were seen. The vastus lateralis muscle reveals 2 to 4K motor units with full recruitment. No fibrillations or positive waves were seen. The iliopsoas muscle reveals 2 to 4K motor units with full recruitment. No fibrillations or positive waves were seen. The biceps femoris muscle (long head) reveals 2 to 4K motor units with full recruitment. No fibrillations or positive waves were seen. The lumbosacral paraspinal muscles were tested at 3 levels, and revealed no abnormalities of insertional activity at all 3 levels tested. There was good relaxation.    IMPRESSION:  Nerve conduction studies done on both lower extremities were within normal limits, without clear evidence of an underlying peripheral neuropathy. A small fiber neuropathy however, that he missed by standard nerve conduction studies. Clinical correlation is required. EMG evaluation of the right lower extremity was unremarkable, without evidence of a lumbosacral radiculopathy. EMG of the left lower extremity shows mild isolated denervation in the peroneal nerve distribution. This may be associated with a low-grade primary motor peroneal neuropathy. No evidence of an overlying lumbosacral radiculopathy is seen.  Marlan Palau MD 01/23/2013 4:40 PM  [facility] [address] [place], [place] [address]  Phone [phone] Fax [phone]

## 2013-01-27 NOTE — Progress Notes (Signed)
Quick Note:  Normal NCS .Please ask patient to wait for Dr A 's return to discuss the EMG findings. CD ______

## 2013-01-29 NOTE — Progress Notes (Signed)
Quick Note:  Spoke with patient and relayed results of his NCS. I informed him that Dr Frances Furbish would be unavailable to review the EMG results until 02/06/13 and that he will be contacted once this is done. Patient understood and had no questions.  ______

## 2013-01-29 NOTE — Progress Notes (Signed)
Hold for Dr Frances Furbish return to review EMG results.

## 2013-03-12 ENCOUNTER — Telehealth: Payer: Self-pay | Admitting: Neurology

## 2013-03-13 MED ORDER — GABAPENTIN 600 MG PO TABS: 600.0000 mg | ORAL_TABLET | Freq: Three times a day (TID) | ORAL | Status: DC

## 2013-03-13 NOTE — Telephone Encounter (Signed)
I tried to reach patient yesterday afternoon, but was not successful.  It appears he called back after hours and the on call doctor called in the Rx for him.  I called him back to verify info.  He said he was advised to increase dose to 600mg  TID by Dr Anne Hahn when he was here for testing because the dose he was taking was not was not working.  Says while at OV, someone came in the room and got MD, so he had to leave.  Patient says he may have mistakenly forgotten to update the chart.  He said he has been taking two 300mg  caps tid.  He would like the Rx updated in the system to one 600mg  tablet tid and that Rx can be sent into local pharm.  Says he is only out of town for less than 2 weeks and has plenty of meds to last until he returns.  I consulted Dr Frances Furbish.  She has authorized change on med list.  I have updated med and sent it to local pharm per patient request.

## 2013-04-17 ENCOUNTER — Ambulatory Visit: Payer: BC Managed Care – PPO | Admitting: Neurology

## 2013-07-18 ENCOUNTER — Other Ambulatory Visit: Payer: Self-pay | Admitting: Neurology

## 2013-10-02 ENCOUNTER — Other Ambulatory Visit: Payer: Self-pay | Admitting: Neurology

## 2013-10-04 ENCOUNTER — Other Ambulatory Visit: Payer: Self-pay | Admitting: Neurology

## 2013-12-04 ENCOUNTER — Other Ambulatory Visit: Payer: Self-pay | Admitting: Neurology

## 2013-12-11 ENCOUNTER — Telehealth: Payer: Self-pay | Admitting: Neurology

## 2013-12-11 MED ORDER — GABAPENTIN 600 MG PO TABS: 600.0000 mg | ORAL_TABLET | Freq: Three times a day (TID) | ORAL | Status: DC

## 2013-12-11 NOTE — Telephone Encounter (Signed)
Pt called did set up an apt in Sept for next availability. Pt states he only recd 30 pills when he usually gets 90. Please call pt concerning this matter and if they will continue with the 90 count until he comes in to see Dr. Anne HahnWillis Thanks

## 2013-12-11 NOTE — Telephone Encounter (Signed)
Rx has been sent to last until appt.  I called the patient back.  Got no answer, no voicemail.  Unable to LM.

## 2014-03-24 IMAGING — CR DG CHEST 2V
2 series · 2 of 2 positions shown · non-contrast
Comparison: 01/24/2006.

CLINICAL DATA: Chest pain.

CHEST - 2 VIEW

[w chest pa]
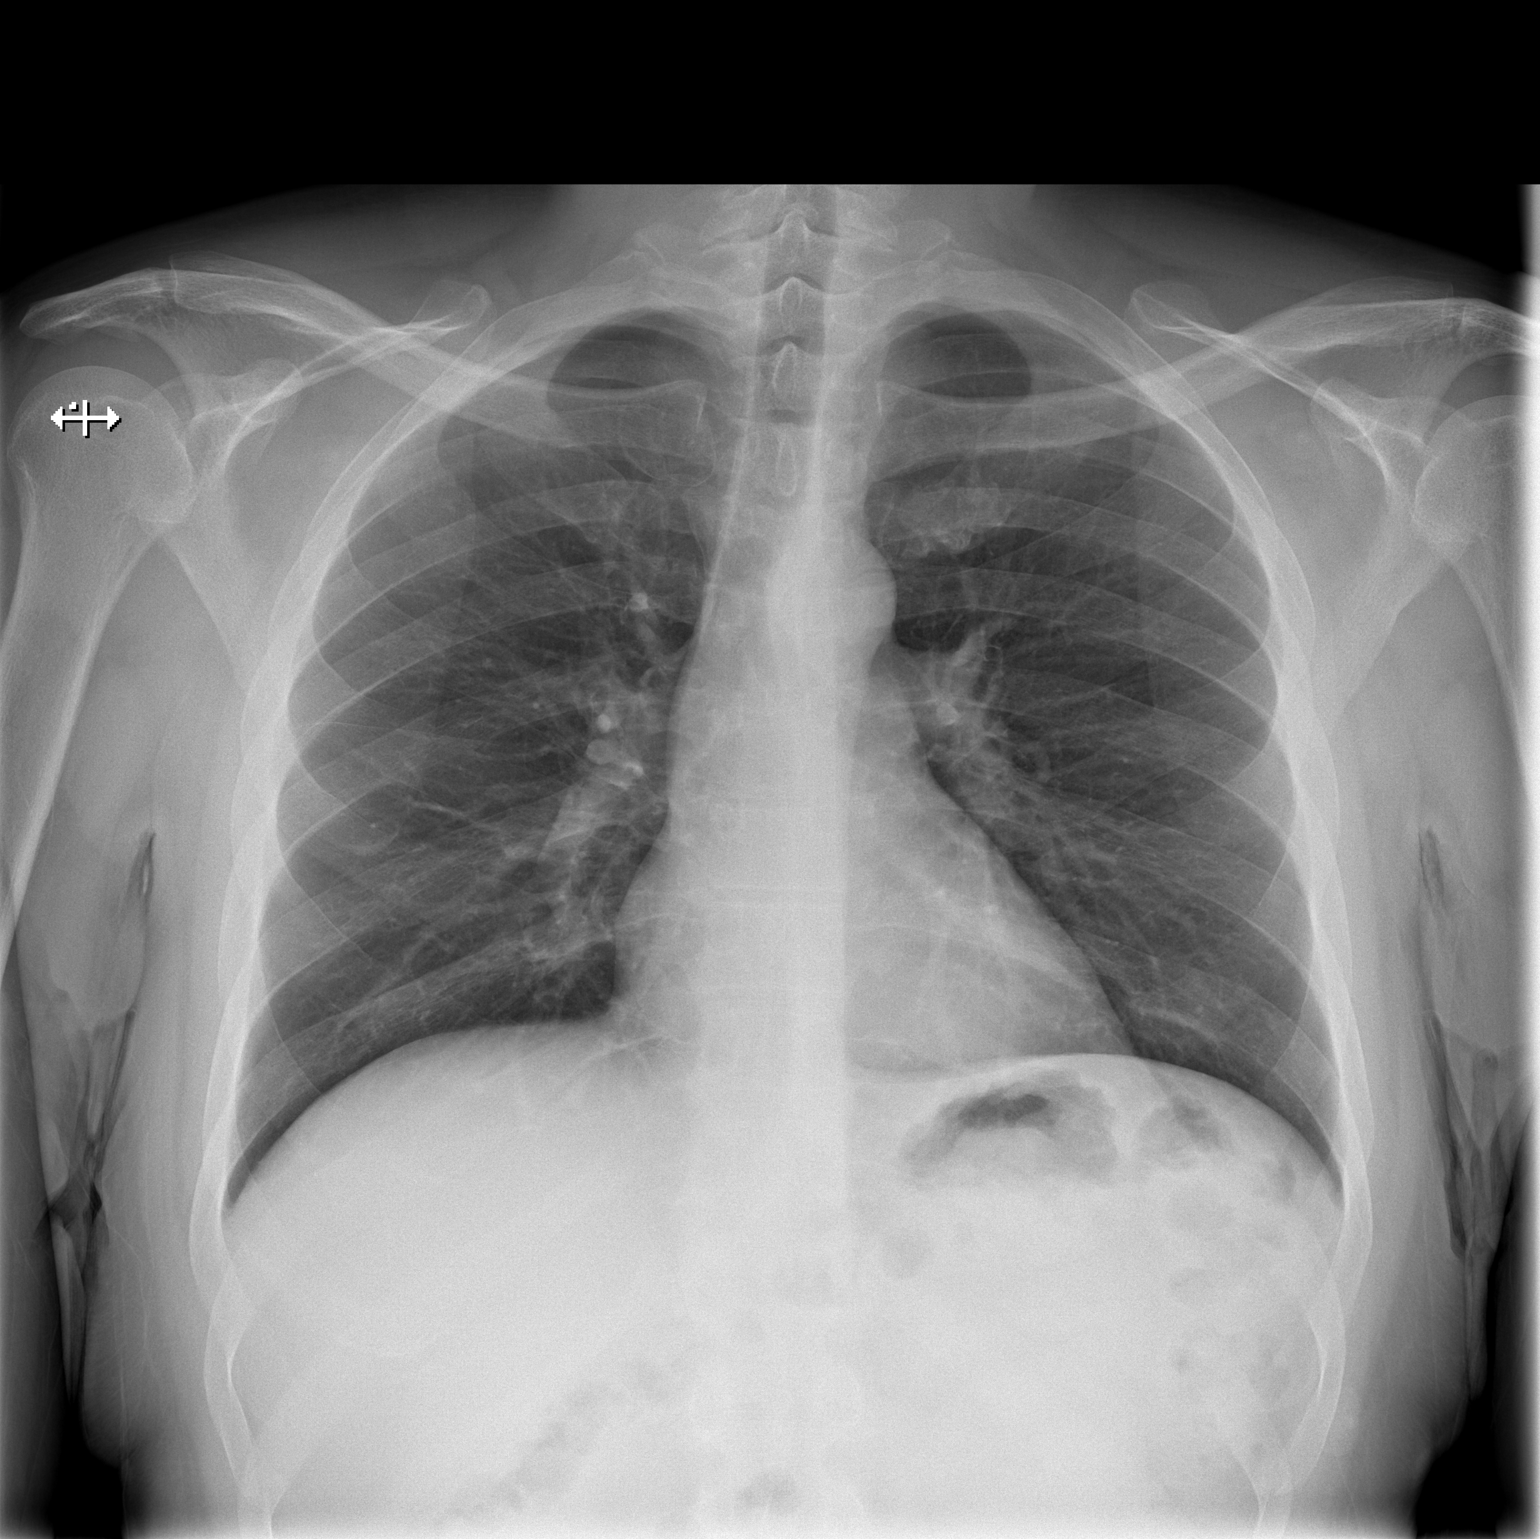

[w chest lat]
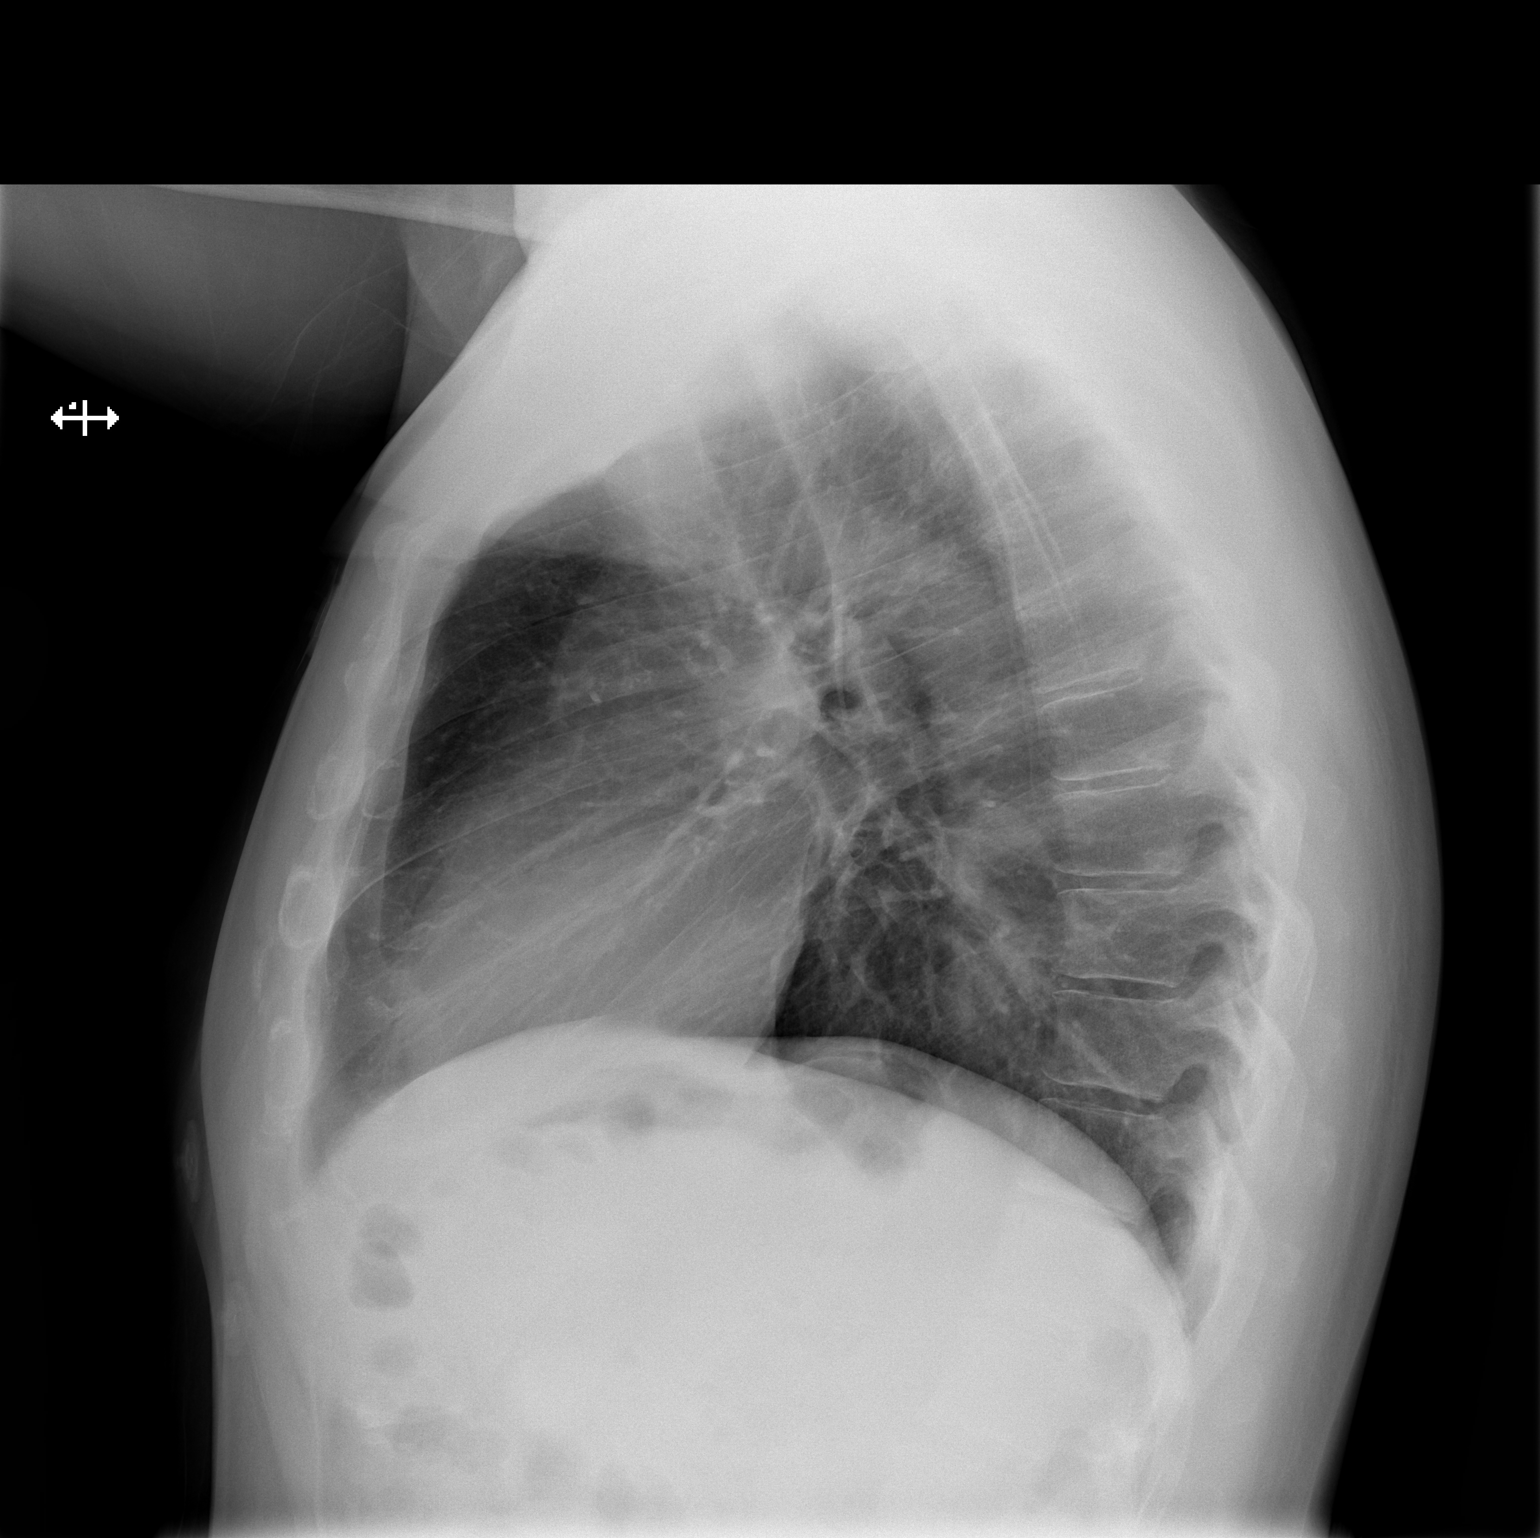

[2 of 2 positions shown; findings below may reference images not displayed]

FINDINGS: The cardiac silhouette, mediastinal and hilar contours
are normal.  The lungs are clear.  No pleural effusion.  The bony
thorax is intact.
IMPRESSION: Normal chest x-ray.

## 2014-04-17 IMAGING — CT CT ABD-PELV W/O CM
1 series · 16 of 25 positions shown, 20 images · non-contrast
Comparison: None.

CLINICAL DATA: Abdominal pain with nausea, vomiting, and diarrhea.
History of kidney stones.

CT ABDOMEN AND PELVIS WITHOUT CONTRAST
TECHNIQUE: Multidetector CT imaging of the abdomen and pelvis was
performed following the standard protocol without intravenous
contrast.

[Series 6: lung · axial · 0.76mm/px · z∈[-85,+25]mm · 16 of 25 slices shown, 20 images]
[im 2/25  soft-tissue]
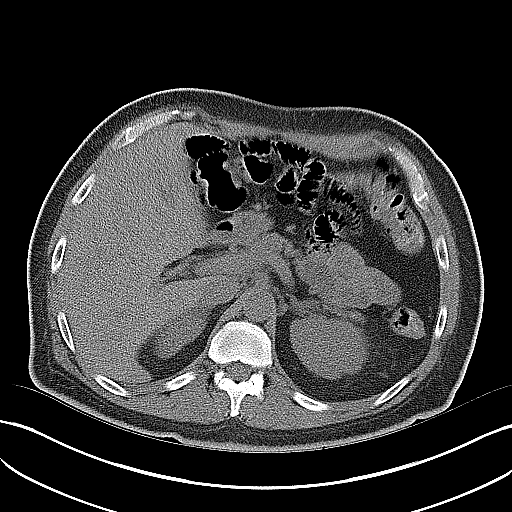
[im 2/25  bone]
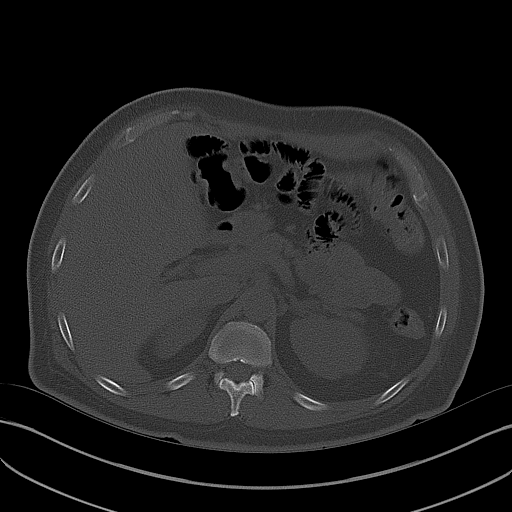
[im 4/25  soft-tissue]
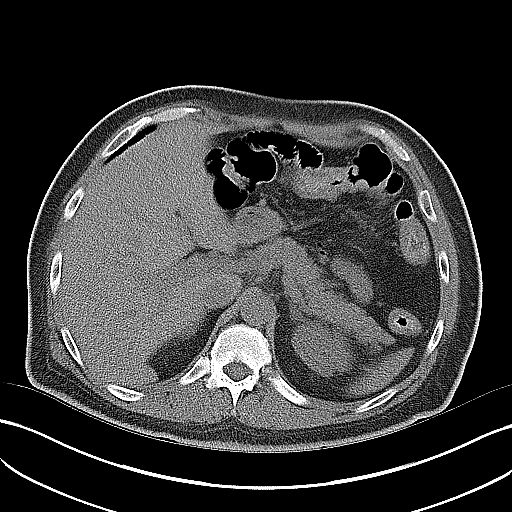
[im 6/25  soft-tissue]
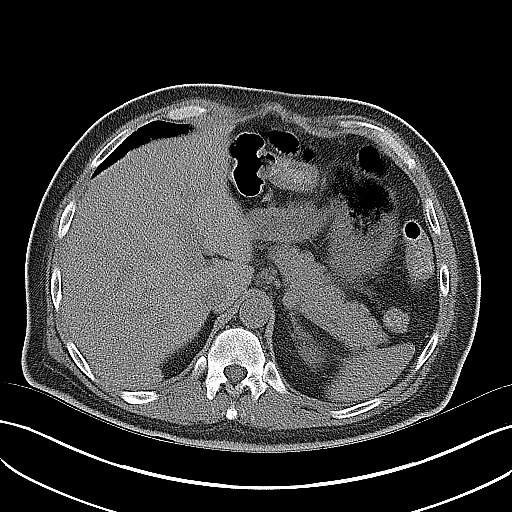
[im 7/25  soft-tissue]
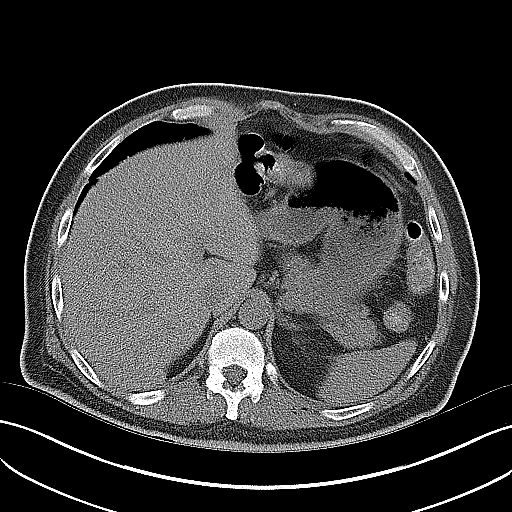
[im 9/25  soft-tissue]
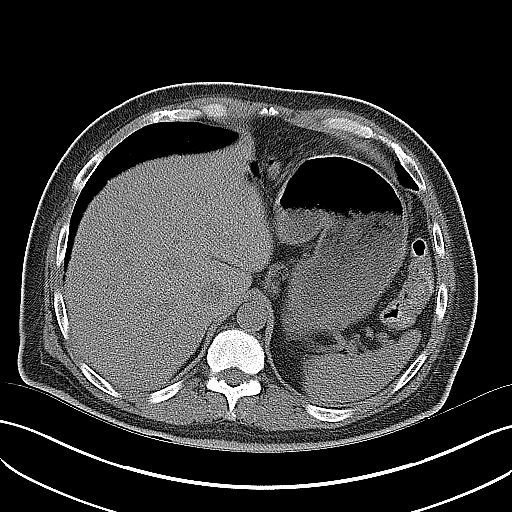
[im 11/25  soft-tissue]
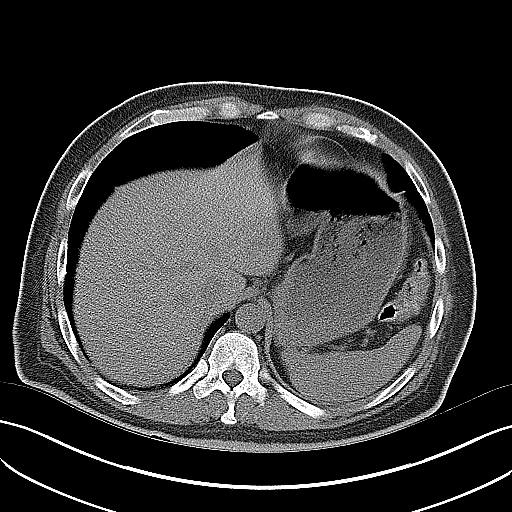
[im 12/25  soft-tissue]
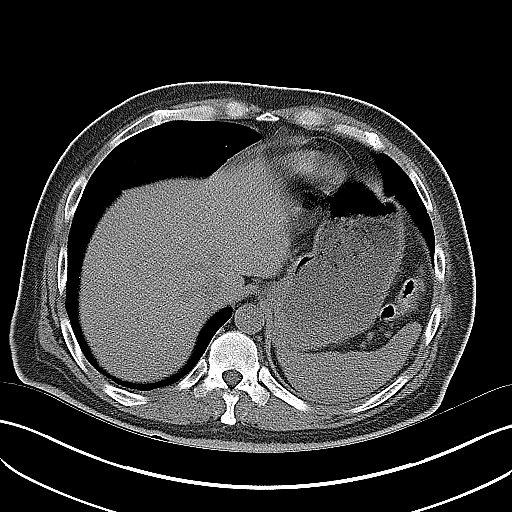
[im 14/25  soft-tissue]
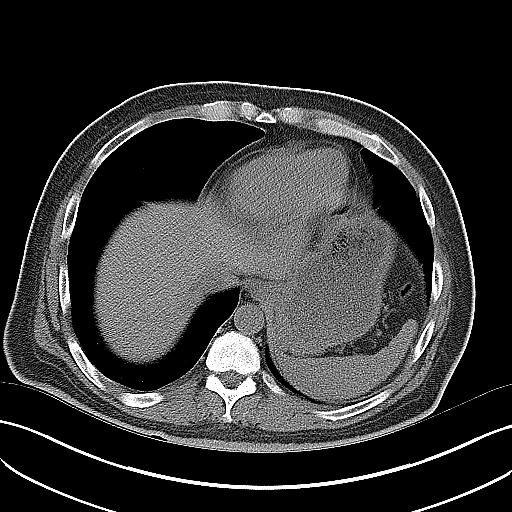
[im 15/25  soft-tissue]
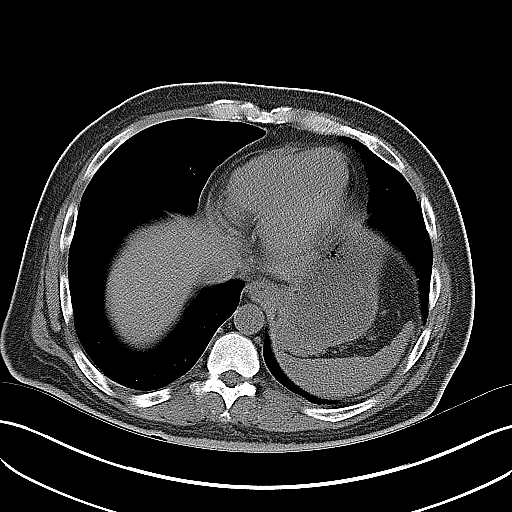
[im 15/25  bone]
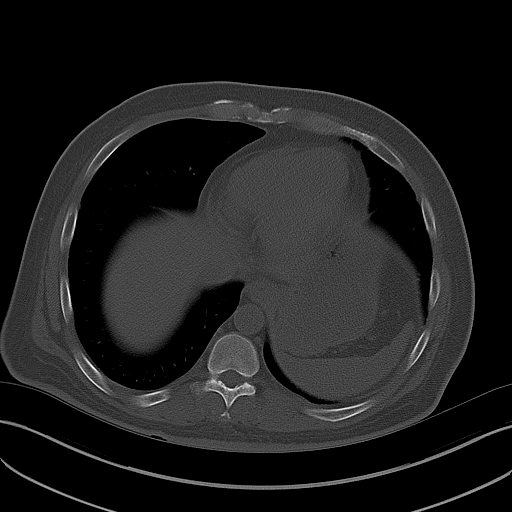
[im 17/25  soft-tissue]
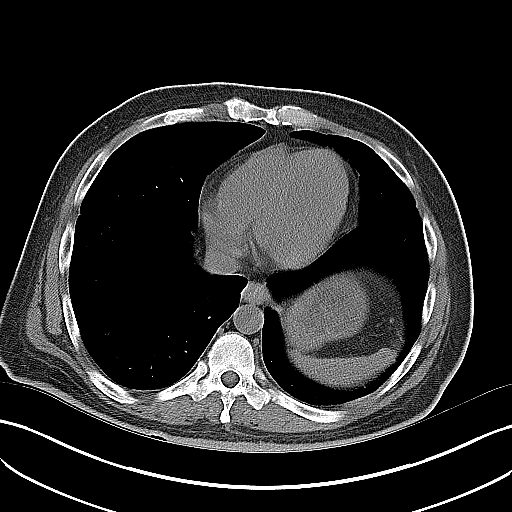
[im 19/25  soft-tissue]
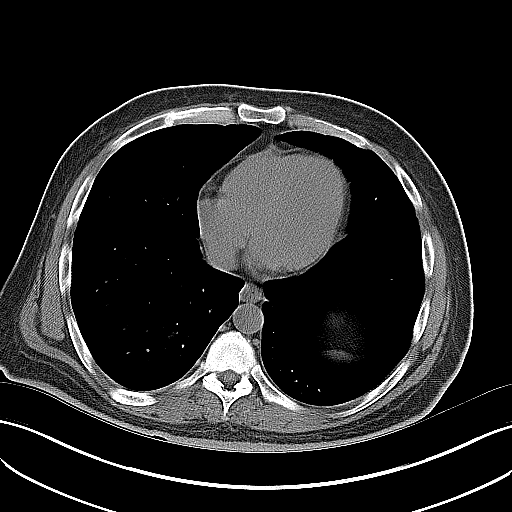
[im 20/25  soft-tissue]
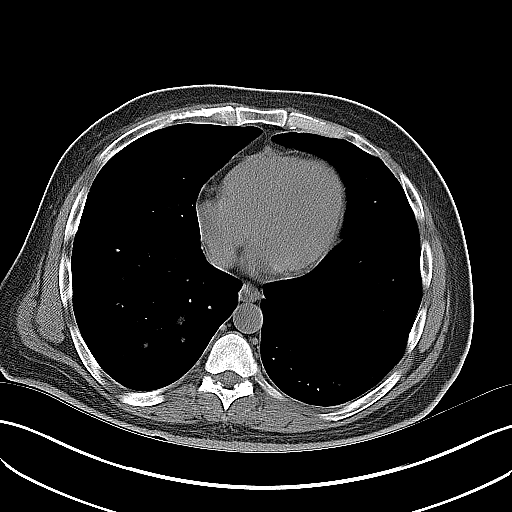
[im 21/25  lung]
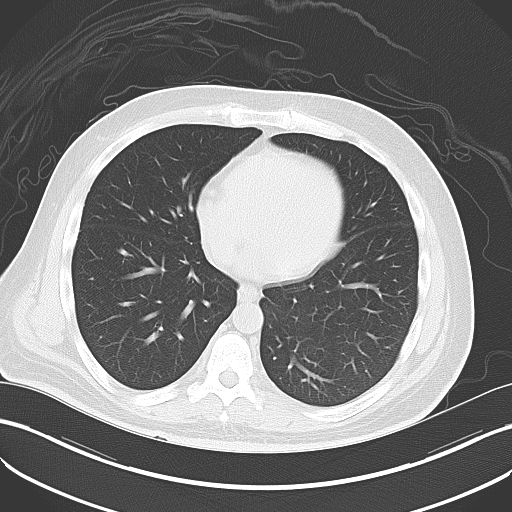
[im 22/25  soft-tissue]
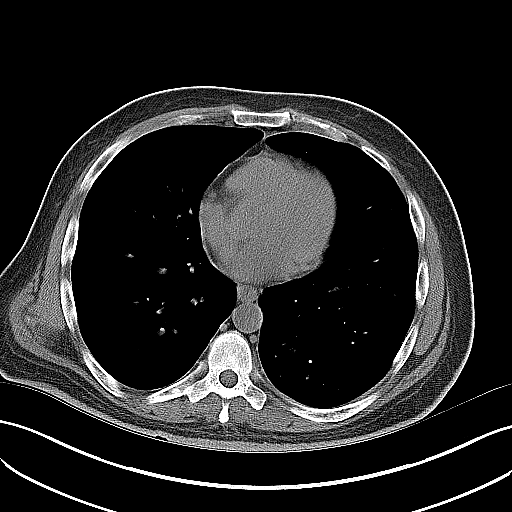
[im 22/25  lung]
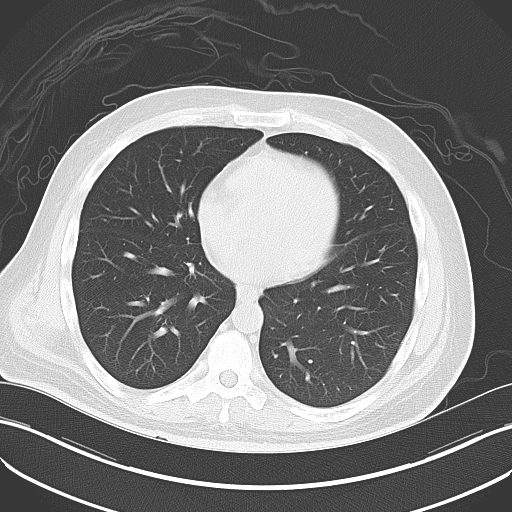
[im 23/25  lung]
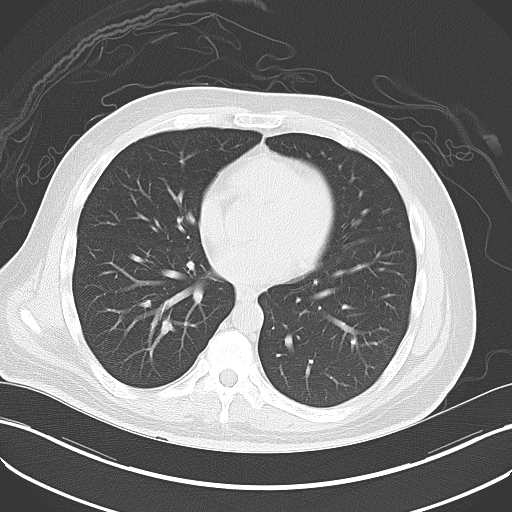
[im 24/25  soft-tissue]
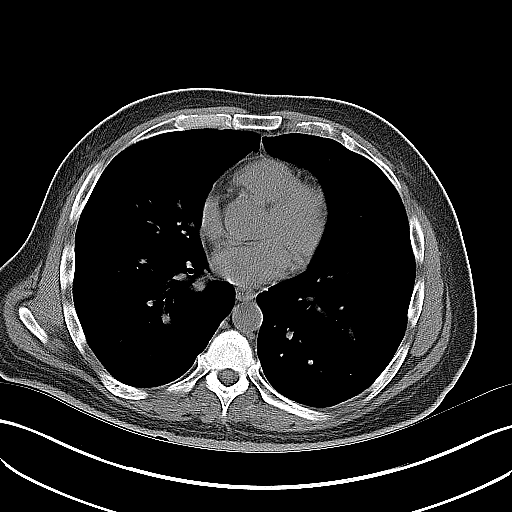
[im 24/25  lung]
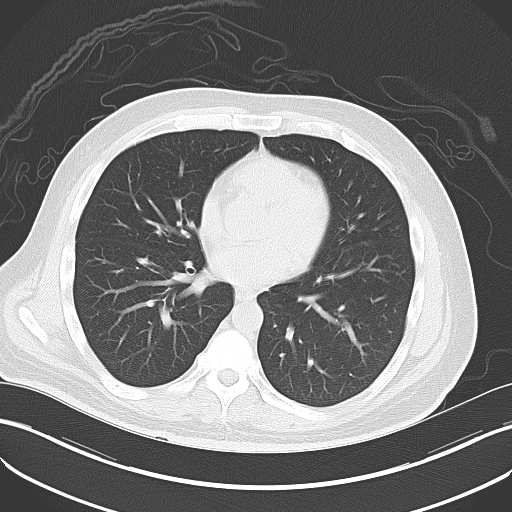

[16 of 25 positions shown; findings below may reference images not displayed]

FINDINGS: Lung bases are clear.  Liver, spleen, pancreas, biliary
tree, adrenal glands, and abdominal aorta are normal.  1.5 mm stone
in the lower pole of the right kidney.  2.5 mm stones in the
midportion of the left kidney.  There are solitary low-density
lesions in  both kidneys measuring approximately 11 mm.  These are
most likely cysts but are nonspecific on this noncontrast study.

The bowel appears normal including the terminal ileum and appendix.
There are no ureteral calculi.  No hydronephrosis.  Multiple
phleboliths in the pelvis. No acute osseous abnormality.
IMPRESSION: No acute abnormality of the abdomen or pelvis.  Tiny stones in both
kidneys.  Small low-density lesions in both kidneys, nonspecific
but statistically most likely cysts.

## 2014-05-07 ENCOUNTER — Telehealth: Payer: Self-pay | Admitting: *Deleted

## 2014-05-07 ENCOUNTER — Telehealth: Payer: Self-pay | Admitting: Neurology

## 2014-05-07 ENCOUNTER — Ambulatory Visit: Payer: BC Managed Care – PPO | Admitting: Neurology

## 2014-05-07 NOTE — Telephone Encounter (Signed)
Patient is a no show for today's appointment(05/07/14@2 :30)

## 2014-05-07 NOTE — Telephone Encounter (Signed)
Closed

## 2014-05-13 ENCOUNTER — Other Ambulatory Visit: Payer: Self-pay | Admitting: Neurology

## 2014-05-15 ENCOUNTER — Telehealth: Payer: Self-pay | Admitting: Neurology

## 2014-05-15 MED ORDER — GABAPENTIN 600 MG PO TABS: 600.0000 mg | ORAL_TABLET | Freq: Three times a day (TID) | ORAL | Status: DC

## 2014-05-15 NOTE — Telephone Encounter (Signed)
Patient calling to request Gabapentin refill since he has an appointment coming up on 10/5.

## 2014-05-15 NOTE — Telephone Encounter (Signed)
We have only sent enough meds to last until appt.  I called the patient back, got no answer.  Left message expressing it is very important he show up to the appt on 10/05

## 2014-05-20 ENCOUNTER — Ambulatory Visit (INDEPENDENT_AMBULATORY_CARE_PROVIDER_SITE_OTHER): Payer: BC Managed Care – PPO | Admitting: Neurology

## 2014-05-20 ENCOUNTER — Encounter (INDEPENDENT_AMBULATORY_CARE_PROVIDER_SITE_OTHER): Payer: Self-pay

## 2014-05-20 ENCOUNTER — Encounter: Payer: Self-pay | Admitting: Neurology

## 2014-05-20 VITALS — BP 123/85 | HR 98 | Ht 70.0 in | Wt 187.0 lb

## 2014-05-20 DIAGNOSIS — R351 Nocturia: Secondary | ICD-10-CM

## 2014-05-20 DIAGNOSIS — E1140 Type 2 diabetes mellitus with diabetic neuropathy, unspecified: Secondary | ICD-10-CM

## 2014-05-20 DIAGNOSIS — E1149 Type 2 diabetes mellitus with other diabetic neurological complication: Secondary | ICD-10-CM

## 2014-05-20 DIAGNOSIS — E1141 Type 2 diabetes mellitus with diabetic mononeuropathy: Secondary | ICD-10-CM

## 2014-05-20 DIAGNOSIS — R0683 Snoring: Secondary | ICD-10-CM

## 2014-05-20 MED ORDER — GABAPENTIN 600 MG PO TABS: 600.0000 mg | ORAL_TABLET | Freq: Three times a day (TID) | ORAL | Status: DC

## 2014-05-20 NOTE — Patient Instructions (Addendum)
We will do blood work today, to check your blood sugar and hemoglobin A1C.   We will continue with gabapentin 600 mg three times a day.

## 2014-05-20 NOTE — Progress Notes (Signed)
Subjective:    Patient ID: [name] [name] is a 43 y.o. male.  HPI    Interim history:   Mr. [name] [name] is a 43 year old right-handed gentleman with an underlying medical history of diabetes (at times poorly controlled), insomnia and kidney stones who presents for followup consultation of his severe foot pain with a burning sensation and numbness in his distal lower extremities in the past few months. He is unaccompanied today. Of note, he no showed for an appointment on 04/17/2013 at 3:30 PM and he no showed for appointment on 05/07/2014 at 2:30 PM.  I first met him on 01/15/2013, at which time he reported having tried oxycodone, but he ran out. He had not tried Lyrica, Cymbalta or Neurontin. He presented to the emergency room in May for severe nausea and vomiting. At the time of his first visit with me, I suggested an EMG/nerve conduction testing, and for symptomatic treatment I suggested a trial of gabapentin starting at 300 mg strength with titration. Labs from 01/15/2013 showed: negative HIV, RPR nonreactive, borderline vitamin D and normal vitamin B12, folate normal ESR. We contacted him via phone regarding his test results and he was advised to start an over-the-counter vitamin D supplement. EMG and nerve conduction testing on 01/23/2013: Nerve conduction studies done on both lower extremities were within normal limits, without clear evidence of an underlying peripheral neuropathy. A small fiber neuropathy however, could be missed by standard nerve conduction studies. Clinical correlation is required. EMG evaluation of the right lower extremity was unremarkable, without evidence of a lumbosacral radiculopathy. EMG of the left lower extremity shows mild isolated denervation in the peroneal nerve distribution. This may be associated with a low-grade primary motor peroneal neuropathy. No evidence of an overlying lumbosacral radiculopathy is seen. Today, he reports, that he quit taking all his medications  about six months ago. He has not been to his PCP in over a year, maybe 18 months. He needs his eyes checked, as he has not seen his eye doctor in about a year. His burning foot pain is about stable he feels. Gabapentin 600 mg 3 times a day has been symptomatically helpful. He snores. He denies witnessed apneas or choking sensations while asleep or morning headaches. He's not particularly tired during the day.  His Past Medical History Is Significant For: Past Medical History  Diagnosis Date  . Hyperlipidemia   . Wears glasses   . Migraine   . Unspecified disorder of skin and subcutaneous tissue     recurrent summertime rash, large lesions scattered?  . Diabetes mellitus type II, uncontrolled     diagnosed age 19yo; week long hospitalization at diagnosis  . Microalbuminuria   . Insomnia     His Past Surgical History Is Significant For: Past Surgical History  Procedure Laterality Date  . Vasectomy      His Family History Is Significant For: Family History  Problem Relation Age of Onset  . Diabetes Maternal Aunt   . Diabetes Maternal Uncle   . Diabetes Maternal Grandmother   . Heart disease Neg Hx   . Stroke Neg Hx   . Cancer Neg Hx     His Social History Is Significant For: History   Social History  . Marital Status: Divorced    Spouse Name: N/A    Number of Children: 3  . Years of Education: college   Occupational History  . [employer]    Social History Main Topics  . Smoking status: Never Smoker   .  Smokeless tobacco: None  . Alcohol Use: Yes     Comment: occasionally  . Drug Use: No  . Sexual Activity: None   Other Topics Concern  . None   Social History Narrative   Divorced, 2 children 44yo, 63yo, lives with brother and daughter, is an Chief Executive Officer for [employer]    His Allergies Are:  No Known Allergies:   His Current Medications Are:  Outpatient Encounter Prescriptions as of 05/20/2014  Medication Sig  . gabapentin (NEURONTIN) 600 MG  tablet Take 1 tablet (600 mg total) by mouth 3 (three) times daily.  . [DISCONTINUED] amoxicillin (AMOXIL) 500 MG capsule Take 1,000 mg by mouth 2 (two) times daily.  . [DISCONTINUED] clarithromycin (BIAXIN) 500 MG tablet Take 500 mg by mouth 2 (two) times daily. x10 days  . [DISCONTINUED] insulin glargine (LANTUS) 100 UNIT/ML injection Inject 20 Units into the skin at bedtime.  . [DISCONTINUED] lansoprazole (PREVACID) 30 MG capsule Take 30 mg by mouth 2 (two) times daily.  . [DISCONTINUED] metFORMIN (GLUCOPHAGE) 1000 MG tablet Take 1 tablet (1,000 mg total) by mouth 2 (two) times daily with a meal.  . [DISCONTINUED] Multiple Vitamin (MULTIVITAMIN WITH MINERALS) TABS Take 1 tablet by mouth daily.  . [DISCONTINUED] omeprazole (PRILOSEC) 20 MG capsule Take 20 mg by mouth 2 (two) times daily.  . [DISCONTINUED] ondansetron (ZOFRAN) 4 MG tablet Take 1 tablet (4 mg total) by mouth every 6 (six) hours.  . [DISCONTINUED] oxyCODONE-acetaminophen (PERCOCET) 10-325 MG per tablet Take 1 tablet by mouth every 4 (four) hours as needed for pain.  . [DISCONTINUED] Probiotic Product (PROBIOTIC DAILY PO) Take by mouth.  . [DISCONTINUED] promethazine (PHENERGAN) 25 MG tablet Take 1 tablet (25 mg total) by mouth every 6 (six) hours as needed for nausea.  . [DISCONTINUED] zolpidem (AMBIEN) 10 MG tablet Take 10 mg by mouth at bedtime as needed for sleep.   :  Review of Systems:  Out of a complete 14 point review of systems, all are reviewed and negative with the exception of these symptoms as listed below:   Review of Systems  Eyes:       Loss of vision  Genitourinary:       Frequency of Urination    Objective:  Neurologic Exam  Physical Exam Physical Examination:   Filed Vitals:   05/20/14 1517  BP: 123/85  Pulse: 98    General Examination: The patient is a pleasant 43 y.o. male in no acute distress. He appears well-developed and well-nourished and well groomed.   HEENT: Normocephalic, atraumatic,  pupils are equal, round and reactive to light and accommodation. Extraocular tracking is good without limitation to gaze excursion or nystagmus noted. Normal smooth pursuit is noted. Hearing is grossly intact. Face is symmetric with normal facial animation and normal facial sensation. Speech is clear with no dysarthria noted. There is no hypophonia. There is no lip, neck/head, jaw or voice tremor. Neck is supple with full range of passive and active motion. There are no carotid bruits on auscultation. Oropharynx exam reveals: mild mouth dryness, good dental hygiene and mild airway crowding, due to elongated uvula and larger tongue. Mallampati is class I. Tongue protrudes centrally and palate elevates symmetrically.   Chest: Clear to auscultation without wheezing, rhonchi or crackles noted.  Heart: S1+S2+0, regular and normal without murmurs, rubs or gallops noted.   Abdomen: Soft, non-tender and non-distended with normal bowel sounds appreciated on auscultation.  Extremities: There is no pitting edema in the distal lower extremities bilaterally. Pedal  pulses are intact.  Skin: Warm and dry without trophic changes noted. There are no varicose veins.  Musculoskeletal: exam reveals no obvious joint deformities, tenderness or joint swelling or erythema.   Neurologically:  Mental status: The patient is awake, alert and oriented in all 4 spheres. His memory, attention, language and knowledge are appropriate. There is no aphasia, agnosia, apraxia or anomia. Speech is clear with normal prosody and enunciation. Thought process is linear. Mood is congruent and affect is normal.  Cranial nerves are as described above under HEENT exam. In addition, shoulder shrug is normal with equal shoulder height noted. Motor exam: Normal bulk, strength and tone is noted. There is no drift, tremor or rebound. Romberg is negative. Reflexes are 1+ in the UEs and trace in the LEs, diminished from last time. Toes are downgoing  bilaterally. Fine motor skills are intact with normal finger taps, normal hand movements, normal rapid alternating patting, normal foot taps and normal foot agility.  Cerebellar testing shows no dysmetria or intention tremor on finger to nose testing. Heel to shin is unremarkable bilaterally. There is no truncal or gait ataxia.  Sensory exam is intact to light touch, pinprick, vibration, temperature sense in the upper extremities with mild decrease to temperature and PP in the LEs distally, in the feet.  Gait, station and balance are unremarkable. No veering to one side is noted. No leaning to one side is noted. Posture is age-appropriate and stance is narrow based. No problems turning are noted. He turns en bloc. He complains of burning sensation while walking. Tandem walk is slightly difficult for him. Intact toe and heel stance is noted.               Assessment and Plan:   In summary, [name] [name] is a very pleasant 43 year old male with a long-standing history of diabetes with poor control in the past who has been experiencing burning pain and tingling as well as numbness in his feet primarily in the soles of his feet. His history and physical exam are most consistent with diabetic neuropathy, painful. He has been on symptomatic treatment with gabapentin 600 mg 3 times a day with reasonable success. I am highly concerned that he has quit all his medications including his diabetes medications. He is strongly encouraged to go back to see his primary care physician. We will do blood work including hemoglobin A1c, and CMP. I am worried about his diabetes control. I am also noticing a change in his exam with diminished reflexes in the lower extremities and slight difficulty with his tandem walk which seems to be new from last time. He strongly advised to follow through with his medical management. I am willing to continue him on gabapentin 600 mg 3 times a day but advised him strongly that his neuropathy  will likely continue to worsen because he is currently untreated for his diabetes and other medical issues. He is advised to be compliant with medical care. We talked about his EMG and nerve conductioresults from last time. We will call him with his test results far as blood work and  I provided him with a prescription for gabapentin 600 mg 3 times a day. I suggested a three-month followup with our nurse practitioner, Ms. Lam and I will see him back afterwards. He was in agreement. Of note, he was also advised to have his friends and family watch for apneic pauses while he breathes at night. He does endorse snoring and I would like  to make sure we are not missing any obstructive sleep apnea-type symptoms. He denies daytime somnolence gasping for air at night. He does endorse nocturia approximately 3 times an average night and increased thirst.

## 2014-05-21 ENCOUNTER — Telehealth: Payer: Self-pay | Admitting: Neurology

## 2014-05-21 LAB — COMPREHENSIVE METABOLIC PANEL
ALBUMIN: 5.2 g/dL (ref 3.5–5.5)
ALT: 12 IU/L (ref 0–44)
AST: 16 IU/L (ref 0–40)
Albumin/Globulin Ratio: 1.8 (ref 1.1–2.5)
Alkaline Phosphatase: 123 IU/L — ABNORMAL HIGH (ref 39–117)
BUN/Creatinine Ratio: 12 (ref 9–20)
BUN: 13 mg/dL (ref 6–24)
CALCIUM: 10.7 mg/dL — AB (ref 8.7–10.2)
CO2: 24 mmol/L (ref 18–29)
Chloride: 91 mmol/L — ABNORMAL LOW (ref 97–108)
Creatinine, Ser: 1.11 mg/dL (ref 0.76–1.27)
GFR calc Af Amer: 94 mL/min/{1.73_m2} (ref >59)
GFR, EST NON AFRICAN AMERICAN: 81 mL/min/{1.73_m2} (ref >59)
GLOBULIN, TOTAL: 2.9 g/dL (ref 1.5–4.5)
GLUCOSE: 489 mg/dL — AB (ref 65–99)
Potassium: 4.6 mmol/L (ref 3.5–5.2)
Sodium: 134 mmol/L (ref 134–144)
TOTAL PROTEIN: 8.1 g/dL (ref 6.0–8.5)
Total Bilirubin: 1 mg/dL (ref 0.0–1.2)

## 2014-05-21 LAB — B12 AND FOLATE PANEL
Folate: 13.7 ng/mL (ref >3.0)
Vitamin B-12: 624 pg/mL (ref 211–946)

## 2014-05-21 LAB — HGB A1C W/O EAG: HEMOGLOBIN A1C: 15.6 % — AB (ref 4.8–5.6)

## 2014-05-21 LAB — VITAMIN D 25 HYDROXY (VIT D DEFICIENCY, FRACTURES): VIT D 25 HYDROXY: 11.5 ng/mL — AB (ref 30.0–100.0)

## 2014-05-21 NOTE — Telephone Encounter (Signed)
I called and talked to Mr. [name] [name] about his abnormal test results. As I was afraid his blood sugar level was rather high. It was 489. Also as expected his hemoglobin A1c was highly elevated, over 15. His vitamin D level was low at 15 and asked him to start an over-the-counter vitamin D supplement, 1000-2000 units daily. He is urged to make a followup with his primary care physician whom he has not seen in over a year and he had told me that he had stopped all his medications over 6 months ago. I explained to him that it is critically important that he see his primary care physician ASAP. He promised he will call first thing in the morning to make an appointment with Dr. Kateri PlummerMorrow. Sandy: Please fax lab results to Dr. Vincente LibertyMorrow's office and my clinic note. thx

## 2014-05-21 NOTE — Telephone Encounter (Signed)
Faxed [phone].

## 2014-05-21 NOTE — Progress Notes (Signed)
Quick Note:  See phone note also. Patient notified of the highly abnormal blood sugar level and hemoglobin A1c. Patient is urged to make a followup appointment ASAP with his primary care physician and will call in the morning to schedule a followup with Dr. Kateri PlummerMorrow,  pls fax my note and labs to Dr. Vincente LibertyMorrow's office Huston FoleySaima Yeng Frankie, MD, PhD [facility] ([facility]  ______

## 2014-05-22 ENCOUNTER — Telehealth: Payer: Self-pay | Admitting: *Deleted

## 2014-05-22 NOTE — Telephone Encounter (Signed)
Message copied by Salome SpottedOBERTS, Jacksyn Beeks M on Wed May 22, 2014  2:32 PM ------      Message from: Huston FoleyATHAR, SAIMA      Created: Tue May 21, 2014  5:15 PM       See phone note also. Patient notified of the highly abnormal blood sugar level and hemoglobin A1c. Patient is urged to make a followup appointment ASAP with his primary care physician and will call in the morning to schedule a followup with Dr. Kateri PlummerMorrow,       pls fax my note and labs to Dr. Vincente LibertyMorrow's office      Huston FoleySaima Athar, MD, PhD      [facility]       ------

## 2014-05-22 NOTE — Telephone Encounter (Signed)
Labs was faxed on 05-22-2014

## 2014-05-27 NOTE — Progress Notes (Signed)
Quick Note:  Faxed to Dr. Kateri PlummerMorrow. 05-22-2014 at 0757. ______

## 2014-06-20 ENCOUNTER — Encounter: Payer: Self-pay | Admitting: *Deleted

## 2014-08-19 ENCOUNTER — Telehealth: Payer: Self-pay | Admitting: Neurology

## 2014-08-19 ENCOUNTER — Ambulatory Visit: Payer: BC Managed Care – PPO | Admitting: Neurology

## 2014-08-19 NOTE — Telephone Encounter (Signed)
Patient is a no show for today's(08/19/14) appointment at 1:15

## 2014-11-18 ENCOUNTER — Ambulatory Visit: Payer: BC Managed Care – PPO | Admitting: Nurse Practitioner

## 2014-11-19 ENCOUNTER — Telehealth: Payer: Self-pay | Admitting: Neurology

## 2014-11-19 NOTE — Telephone Encounter (Signed)
No showed last appt  

## 2014-11-22 ENCOUNTER — Telehealth: Payer: Self-pay | Admitting: Neurology

## 2014-11-22 NOTE — Telephone Encounter (Signed)
Patient no showed last appt.  I called back. Got no answer.  Left message asking he call back to reschedule.

## 2014-11-22 NOTE — Telephone Encounter (Signed)
Pt is calling requesting a refill on gabapentin (NEURONTIN) 600 MG tablet he took his last pill.  Please call and advise.

## 2014-11-22 NOTE — Telephone Encounter (Signed)
Patient has an appointment scheduled 01/15/15 with Dr. Frances FurbishAthar.

## 2014-11-22 NOTE — Telephone Encounter (Signed)
Refill sent to last until appt.  

## 2015-01-15 ENCOUNTER — Ambulatory Visit (INDEPENDENT_AMBULATORY_CARE_PROVIDER_SITE_OTHER): Payer: BLUE CROSS/BLUE SHIELD | Admitting: Neurology

## 2015-01-15 ENCOUNTER — Encounter: Payer: Self-pay | Admitting: Neurology

## 2015-01-15 VITALS — BP 106/70 | HR 92 | Resp 18 | Ht 70.0 in | Wt 180.0 lb

## 2015-01-15 DIAGNOSIS — E1141 Type 2 diabetes mellitus with diabetic mononeuropathy: Secondary | ICD-10-CM | POA: Diagnosis not present

## 2015-01-15 DIAGNOSIS — E1140 Type 2 diabetes mellitus with diabetic neuropathy, unspecified: Secondary | ICD-10-CM | POA: Diagnosis not present

## 2015-01-15 DIAGNOSIS — E1149 Type 2 diabetes mellitus with other diabetic neurological complication: Secondary | ICD-10-CM

## 2015-01-15 MED ORDER — GABAPENTIN 600 MG PO TABS: 600.0000 mg | ORAL_TABLET | Freq: Three times a day (TID) | ORAL | Status: DC

## 2015-01-15 NOTE — Patient Instructions (Signed)
We will continue with gabapentin 600 mg 3 times a day for diabetic neuropathy.  Continue to work on better blood sugar control with your primary doctor.  I will see you back in 6 months.

## 2015-01-15 NOTE — Progress Notes (Signed)
Subjective:    Patient ID: [name] [name] is a 44 y.o. male.  HPI     Interim history:   Mr. [name] [name] is a 44 year old right-handed gentleman with an underlying medical history of uncontrolled diabetes, insomnia and kidney stones who presents for followup consultation of his painful diabetic neuropathy. The patient is unaccompanied today. I last saw him on 05/20/2014, at which time he reported that he quit taking all his medications about six months prior. He had not seen his PCP in over a year, maybe 18 months. He needed his eyes checked, as he had not seen his eye doctor in about a year. His burning foot pain was stable. Gabapentin 600 mg 3 times a day was helpful. I suggested he continue gabapentin 600 mg 3 times a day. We did blood work which showed a blood sugar level of 489 and a hemoglobin A1c of 15.6, B12 of 624, folate of 13.7 and vitamin D of 11.11.5. We called him with the results and he was advised strongly to make an appointment with his primary care physician urgently. Of note, he no showed for an appointment on 08/19/2014.  Today, 01/15/2015: he reports doing about the same. His symptoms and his legs are about the same. He takes vitamin D over-the-counter. He takes insulin at night. He takes no oral medication for his diabetes. He was not able to tolerate certain diabetes medications in the recent past. His A1c in October 2015 was about 15. Since then he had it rechecked with his primary care physician and states it was around 12. He had some blood work yesterday and we will request test results. He takes cholesterol medication. He is trying to lose weight.  Previously:  Of note, he no showed for an appointment on 04/17/2013 at 3:30 PM and he no showed for appointment on 05/07/2014 at 2:30 PM.   I first met him on 01/15/2013, at which time he reported having tried oxycodone, but he ran out. He had not tried Lyrica, Cymbalta or Neurontin. He presented to the emergency room in May for  severe nausea and vomiting. At the time of his first visit with me, I suggested an EMG/nerve conduction testing, and for symptomatic treatment I suggested a trial of gabapentin starting at 300 mg strength with titration. Labs from 01/15/2013 showed: negative HIV, RPR nonreactive, borderline vitamin D and normal vitamin B12, folate normal ESR. We contacted him via phone regarding his test results and he was advised to start an over-the-counter vitamin D supplement. EMG and nerve conduction testing on 01/23/2013: Nerve conduction studies done on both lower extremities were within normal limits, without clear evidence of an underlying peripheral neuropathy. A small fiber neuropathy however, could be missed by standard nerve conduction studies. Clinical correlation is required. EMG evaluation of the right lower extremity was unremarkable, without evidence of a lumbosacral radiculopathy. EMG of the left lower extremity shows mild isolated denervation in the peroneal nerve distribution. This may be associated with a low-grade primary motor peroneal neuropathy. No evidence of an overlying lumbosacral radiculopathy is seen.  His Past Medical History Is Significant For: Past Medical History  Diagnosis Date  . Hyperlipidemia   . Wears glasses   . Migraine   . Unspecified disorder of skin and subcutaneous tissue     recurrent summertime rash, large lesions scattered?  . Diabetes mellitus type II, uncontrolled     diagnosed age 66yo; week long hospitalization at diagnosis  . Microalbuminuria   . Insomnia  His Past Surgical History Is Significant For: Past Surgical History  Procedure Laterality Date  . Vasectomy      His Family History Is Significant For: Family History  Problem Relation Age of Onset  . Diabetes Maternal Aunt   . Diabetes Maternal Uncle   . Diabetes Maternal Grandmother   . Heart disease Neg Hx   . Stroke Neg Hx   . Cancer Neg Hx     His Social History Is Significant  For: History   Social History  . Marital Status: Divorced    Spouse Name: N/A  . Number of Children: 3  . Years of Education: college   Occupational History  . [employer]    Social History Main Topics  . Smoking status: Never Smoker   . Smokeless tobacco: Not on file  . Alcohol Use: 0.0 oz/week    0 Standard drinks or equivalent per week     Comment: occasionally  . Drug Use: No  . Sexual Activity: Not on file   Other Topics Concern  . None   Social History Narrative   Divorced, 2 children 33yo, 14yo, lives with brother and daughter, is an Chief Executive Officer for [employer]    His Allergies Are:  No Known Allergies:   His Current Medications Are:  Outpatient Encounter Prescriptions as of 01/15/2015  Medication Sig  . atorvastatin (LIPITOR) 20 MG tablet Take 20 mg by mouth daily.  . cholecalciferol (VITAMIN D) 1000 UNITS tablet Take 1,000 Units by mouth daily.  Marland Kitchen gabapentin (NEURONTIN) 600 MG tablet TAKE ONE TABLET BY MOUTH THREE TIMES DAILY   No facility-administered encounter medications on file as of 01/15/2015.  :  Review of Systems:  Out of a complete 14 point review of systems, all are reviewed and negative with the exception of these symptoms as listed below:  Review of Systems  Neurological:       Pain in both feet, needs gabapentin renewed (states that most days he feels that current dosage is working well for him)    Objective:  Neurologic Exam  Physical Exam Physical Examination:   Filed Vitals:   01/15/15 1238  BP: 106/70  Pulse: 92  Resp: 18    General Examination: The patient is a pleasant 44 y.o. male in no acute distress. He appears well-developed and well-nourished and well groomed.   HEENT: Normocephalic, atraumatic, pupils are equal, round and reactive to light and accommodation. Extraocular tracking is good without limitation to gaze excursion or nystagmus noted. Normal smooth pursuit is noted. Hearing is grossly intact. Face is  symmetric with normal facial animation and normal facial sensation. Speech is clear with no dysarthria noted. There is no hypophonia. There is no lip, neck/head, jaw or voice tremor. Neck is supple with full range of passive and active motion. There are no carotid bruits on auscultation. Oropharynx exam reveals: mild mouth dryness, good dental hygiene and mild airway crowding, due to elongated uvula and larger tongue. Mallampati is class I. Tongue protrudes centrally and palate elevates symmetrically.   Chest: Clear to auscultation without wheezing, rhonchi or crackles noted.  Heart: S1+S2+0, regular and normal without murmurs, rubs or gallops noted.   Abdomen: Soft, non-tender and non-distended with normal bowel sounds appreciated on auscultation.  Extremities: There is no pitting edema in the distal lower extremities bilaterally. Pedal pulses are intact.  Skin: Warm and dry without trophic changes noted. There are no varicose veins.  Musculoskeletal: exam reveals no obvious joint deformities, tenderness or joint swelling or  erythema.   Neurologically:  Mental status: The patient is awake, alert and oriented in all 4 spheres. His memory, attention, language and knowledge are appropriate. There is no aphasia, agnosia, apraxia or anomia. Speech is clear with normal prosody and enunciation. Thought process is linear. Mood is congruent and affect is normal.  Cranial nerves are as described above under HEENT exam. In addition, shoulder shrug is normal with equal shoulder height noted. Motor exam: Normal bulk, strength and tone is noted. There is no drift, tremor or rebound. Romberg is negative. Reflexes are 1+ in the UEs and trace in the LEs, diminished from last time. Toes are downgoing bilaterally. Fine motor skills are intact with normal finger taps, normal hand movements, normal rapid alternating patting, normal foot taps and normal foot agility.  Cerebellar testing shows no dysmetria or intention  tremor on finger to nose testing. Heel to shin is unremarkable bilaterally. There is no truncal or gait ataxia.  Sensory exam is intact to light touch, pinprick, vibration, temperature sense in the upper extremities with decrease to temperature and PP in the LEs distally, up to knees on the right, and up to above ankle on the L, somewhat worse from last time. .  Gait, station and balance are unremarkable. No veering to one side is noted. No leaning to one side is noted. Posture is age-appropriate and stance is narrow based. No problems turning are noted. He turns en bloc. He complains of burning sensation while walking. Tandem walk is slightly difficult for him, maybe better than last time.                Assessment and Plan:   In summary, [name] T Lauf is a very pleasant 44 year old male with a long-standing history of diabetes with poor control in the past who has been experiencing burning pain and tingling as well as numbness in his feet primarily in the soles of his feet. His history and physical exam are most consistent with diabetic neuropathy, painful. He has been on symptomatic treatment with gabapentin 600 mg 3 times a day with reasonable success. He has a history of poorly controlled diabetes. He has a history of noncompliance with diabetes medications. He reports being on insulin currently. He is followed by his primary care physician and had recent blood work which we will request. His exam for the most part is stable but he does appear to have a more expanded area reduce sensitivity in his lower extremities. Right side seems a little worse than left. I suggested we continue with gabapentin 600 mg 3 times a day but advised him strongly that his neuropathy will likely continue to worsen because unless his diabetes management is optimized. He's working with his primary care physician on this and his A1c has improved from what I understand. I provided him with a prescription for gabapentin 600 mg 3  times a day. I suggested a 46-month followup, sooner if needed.  I answered all his questions today and the patient was in agreement.  I spent 15 minutes in total face-to-face time with the patient, more than 50% of which was spent in counseling and coordination of care, reviewing test results, reviewing medication and discussing or reviewing the diagnosis of diabetic neuropathy, its prognosis and treatment options.

## 2015-04-22 ENCOUNTER — Telehealth: Payer: Self-pay | Admitting: Neurology

## 2015-04-22 DIAGNOSIS — E1140 Type 2 diabetes mellitus with diabetic neuropathy, unspecified: Secondary | ICD-10-CM

## 2015-04-22 DIAGNOSIS — E1149 Type 2 diabetes mellitus with other diabetic neurological complication: Secondary | ICD-10-CM

## 2015-04-22 NOTE — Telephone Encounter (Signed)
There are a number of neurologists in that area and the only name that comes to my mind is Dr. Donnel Saxon. Pls place referral, thx.

## 2015-04-22 NOTE — Telephone Encounter (Signed)
Patient called stating he is moving to [place]. He is inquiring if Dr Frances Furbish could refer him to a neurologist in that area. Please call and advise. Patient can be reached at [phone].

## 2015-04-23 NOTE — Telephone Encounter (Signed)
Left message that we will refer him to Dr. Allena Katz in [place]. I will put in referral today. I left call back number for further questions.

## 2015-07-08 ENCOUNTER — Telehealth: Payer: Self-pay

## 2015-07-08 NOTE — Telephone Encounter (Signed)
I started to call patient due to template change, but got vm. I did not leave message. I was able to fix template without talking to patient.

## 2015-07-16 ENCOUNTER — Telehealth: Payer: Self-pay

## 2015-07-16 NOTE — Telephone Encounter (Signed)
Left message for patient. Stated that we were under the impression that he is seeing a Insurance account managereurologist in [place]. I asked him to call back if he needs to cancel his appt with us tomorrow.

## 2015-07-17 ENCOUNTER — Ambulatory Visit: Payer: Self-pay | Admitting: Neurology

## 2015-07-17 ENCOUNTER — Telehealth: Payer: Self-pay

## 2015-07-17 NOTE — Telephone Encounter (Signed)
Patient did not show to appt today  

## 2022-03-30 DIAGNOSIS — E119 Type 2 diabetes mellitus without complications: Secondary | ICD-10-CM | POA: Diagnosis not present

## 2022-03-30 DIAGNOSIS — H59032 Cystoid macular edema following cataract surgery, left eye: Secondary | ICD-10-CM | POA: Diagnosis not present

## 2022-03-30 DIAGNOSIS — H35033 Hypertensive retinopathy, bilateral: Secondary | ICD-10-CM | POA: Diagnosis not present

## 2022-04-07 DIAGNOSIS — D125 Benign neoplasm of sigmoid colon: Secondary | ICD-10-CM | POA: Diagnosis not present

## 2022-04-07 DIAGNOSIS — R1013 Epigastric pain: Secondary | ICD-10-CM | POA: Diagnosis not present

## 2022-04-07 DIAGNOSIS — K209 Esophagitis, unspecified without bleeding: Secondary | ICD-10-CM | POA: Diagnosis not present

## 2022-04-07 DIAGNOSIS — K2289 Other specified disease of esophagus: Secondary | ICD-10-CM | POA: Diagnosis not present

## 2022-04-07 DIAGNOSIS — D132 Benign neoplasm of duodenum: Secondary | ICD-10-CM | POA: Diagnosis not present

## 2022-04-07 DIAGNOSIS — K5909 Other constipation: Secondary | ICD-10-CM | POA: Diagnosis not present

## 2022-04-07 DIAGNOSIS — K222 Esophageal obstruction: Secondary | ICD-10-CM | POA: Diagnosis not present

## 2022-04-07 DIAGNOSIS — K635 Polyp of colon: Secondary | ICD-10-CM | POA: Diagnosis not present

## 2022-04-07 DIAGNOSIS — R194 Change in bowel habit: Secondary | ICD-10-CM | POA: Diagnosis not present

## 2022-04-07 DIAGNOSIS — R112 Nausea with vomiting, unspecified: Secondary | ICD-10-CM | POA: Diagnosis not present

## 2022-04-07 DIAGNOSIS — K5730 Diverticulosis of large intestine without perforation or abscess without bleeding: Secondary | ICD-10-CM | POA: Diagnosis not present

## 2022-04-07 DIAGNOSIS — K317 Polyp of stomach and duodenum: Secondary | ICD-10-CM | POA: Diagnosis not present

## 2022-04-07 DIAGNOSIS — K648 Other hemorrhoids: Secondary | ICD-10-CM | POA: Diagnosis not present

## 2022-04-26 DIAGNOSIS — E1140 Type 2 diabetes mellitus with diabetic neuropathy, unspecified: Secondary | ICD-10-CM | POA: Diagnosis not present

## 2022-04-26 DIAGNOSIS — Z23 Encounter for immunization: Secondary | ICD-10-CM | POA: Diagnosis not present

## 2022-04-26 DIAGNOSIS — L853 Xerosis cutis: Secondary | ICD-10-CM | POA: Diagnosis not present

## 2022-04-26 DIAGNOSIS — Z794 Long term (current) use of insulin: Secondary | ICD-10-CM | POA: Diagnosis not present

## 2022-04-27 DIAGNOSIS — H59032 Cystoid macular edema following cataract surgery, left eye: Secondary | ICD-10-CM | POA: Diagnosis not present

## 2022-04-27 DIAGNOSIS — H35032 Hypertensive retinopathy, left eye: Secondary | ICD-10-CM | POA: Diagnosis not present

## 2022-05-17 DIAGNOSIS — K3184 Gastroparesis: Secondary | ICD-10-CM | POA: Diagnosis not present

## 2022-05-17 DIAGNOSIS — K5909 Other constipation: Secondary | ICD-10-CM | POA: Diagnosis not present

## 2022-05-17 DIAGNOSIS — Z79899 Other long term (current) drug therapy: Secondary | ICD-10-CM | POA: Diagnosis not present

## 2022-10-25 DIAGNOSIS — E1140 Type 2 diabetes mellitus with diabetic neuropathy, unspecified: Secondary | ICD-10-CM | POA: Diagnosis not present

## 2022-10-25 DIAGNOSIS — Z125 Encounter for screening for malignant neoplasm of prostate: Secondary | ICD-10-CM | POA: Diagnosis not present

## 2022-10-25 DIAGNOSIS — Z114 Encounter for screening for human immunodeficiency virus [HIV]: Secondary | ICD-10-CM | POA: Diagnosis not present

## 2022-10-25 DIAGNOSIS — E079 Disorder of thyroid, unspecified: Secondary | ICD-10-CM | POA: Diagnosis not present

## 2022-10-25 DIAGNOSIS — Z0000 Encounter for general adult medical examination without abnormal findings: Secondary | ICD-10-CM | POA: Diagnosis not present

## 2022-10-25 DIAGNOSIS — Z794 Long term (current) use of insulin: Secondary | ICD-10-CM | POA: Diagnosis not present

## 2022-10-25 DIAGNOSIS — E041 Nontoxic single thyroid nodule: Secondary | ICD-10-CM | POA: Diagnosis not present

## 2022-10-25 DIAGNOSIS — Z23 Encounter for immunization: Secondary | ICD-10-CM | POA: Diagnosis not present

## 2022-10-25 DIAGNOSIS — Z1159 Encounter for screening for other viral diseases: Secondary | ICD-10-CM | POA: Diagnosis not present

## 2022-12-17 DIAGNOSIS — Z794 Long term (current) use of insulin: Secondary | ICD-10-CM | POA: Diagnosis not present

## 2022-12-17 DIAGNOSIS — E1140 Type 2 diabetes mellitus with diabetic neuropathy, unspecified: Secondary | ICD-10-CM | POA: Diagnosis not present

## 2022-12-17 DIAGNOSIS — E0590 Thyrotoxicosis, unspecified without thyrotoxic crisis or storm: Secondary | ICD-10-CM | POA: Diagnosis not present

## 2022-12-17 DIAGNOSIS — R809 Proteinuria, unspecified: Secondary | ICD-10-CM | POA: Diagnosis not present

## 2022-12-17 DIAGNOSIS — E1129 Type 2 diabetes mellitus with other diabetic kidney complication: Secondary | ICD-10-CM | POA: Diagnosis not present

## 2023-01-12 DIAGNOSIS — D132 Benign neoplasm of duodenum: Secondary | ICD-10-CM | POA: Diagnosis not present

## 2023-01-12 DIAGNOSIS — K5904 Chronic idiopathic constipation: Secondary | ICD-10-CM | POA: Diagnosis not present

## 2023-01-12 DIAGNOSIS — K219 Gastro-esophageal reflux disease without esophagitis: Secondary | ICD-10-CM | POA: Diagnosis not present

## 2023-01-12 DIAGNOSIS — K3184 Gastroparesis: Secondary | ICD-10-CM | POA: Diagnosis not present

## 2023-01-25 DIAGNOSIS — D132 Benign neoplasm of duodenum: Secondary | ICD-10-CM | POA: Diagnosis not present

## 2023-04-10 ENCOUNTER — Other Ambulatory Visit: Payer: Self-pay

## 2023-04-10 ENCOUNTER — Encounter: Payer: Self-pay | Admitting: Emergency Medicine

## 2023-04-10 ENCOUNTER — Emergency Department
Admission: EM | Admit: 2023-04-10 | Discharge: 2023-04-10 | Disposition: A | Discharge status: Home / Self Care | Payer: BC Managed Care – PPO | Attending: Emergency Medicine | Admitting: Emergency Medicine

## 2023-04-10 DIAGNOSIS — R000 Tachycardia, unspecified: Secondary | ICD-10-CM | POA: Insufficient documentation

## 2023-04-10 DIAGNOSIS — E860 Dehydration: Secondary | ICD-10-CM | POA: Diagnosis not present

## 2023-04-10 DIAGNOSIS — R1111 Vomiting without nausea: Secondary | ICD-10-CM | POA: Diagnosis not present

## 2023-04-10 DIAGNOSIS — R1110 Vomiting, unspecified: Secondary | ICD-10-CM | POA: Diagnosis not present

## 2023-04-10 LAB — COMPREHENSIVE METABOLIC PANEL
ALT: 22 U/L (ref 0–44)
AST: 29 U/L (ref 15–41)
Albumin: 4.6 g/dL (ref 3.5–5.0)
Alkaline Phosphatase: 86 U/L (ref 38–126)
Anion gap: 11 (ref 5–15)
BUN: 21 mg/dL — ABNORMAL HIGH (ref 6–20)
CO2: 25 mmol/L (ref 22–32)
Calcium: 9.5 mg/dL (ref 8.9–10.3)
Chloride: 101 mmol/L (ref 98–111)
Creatinine, Ser: 1.41 mg/dL — ABNORMAL HIGH (ref 0.61–1.24)
GFR, Estimated: 60 mL/min — ABNORMAL LOW (ref >60)
Glucose, Bld: 114 mg/dL — ABNORMAL HIGH (ref 70–99)
Potassium: 3.9 mmol/L (ref 3.5–5.1)
Sodium: 137 mmol/L (ref 135–145)
Total Bilirubin: 2.6 mg/dL — ABNORMAL HIGH (ref 0.3–1.2)
Total Protein: 8.5 g/dL — ABNORMAL HIGH (ref 6.5–8.1)

## 2023-04-10 LAB — CBC
HCT: 52.5 % — ABNORMAL HIGH (ref 39.0–52.0)
Hemoglobin: 17.2 g/dL — ABNORMAL HIGH (ref 13.0–17.0)
MCH: 28.2 pg (ref 26.0–34.0)
MCHC: 32.8 g/dL (ref 30.0–36.0)
MCV: 86.2 fL (ref 80.0–100.0)
Platelets: 363 10*3/uL (ref 150–400)
RBC: 6.09 MIL/uL — ABNORMAL HIGH (ref 4.22–5.81)
RDW: 13.1 % (ref 11.5–15.5)
WBC: 8.3 10*3/uL (ref 4.0–10.5)
nRBC: 0 % (ref 0.0–0.2)

## 2023-04-10 LAB — LIPASE, BLOOD: Lipase: 35 U/L (ref 11–51)

## 2023-04-10 MED ORDER — ONDANSETRON 4 MG PO TBDP
4.0000 mg | ORAL_TABLET | Freq: Once | ORAL | Status: AC | PRN
  Administered 2023-04-10: 4 mg via ORAL
  Filled 2023-04-10: qty 1

## 2023-04-10 MED ORDER — LACTATED RINGERS IV BOLUS
1000.0000 mL | Freq: Once | INTRAVENOUS | Status: AC
  Administered 2023-04-10: 1000 mL via INTRAVENOUS

## 2023-04-10 MED ORDER — METOCLOPRAMIDE HCL 10 MG PO TABS: 20.0000 mg | ORAL_TABLET | Freq: Three times a day (TID) | ORAL | 1 refills | Status: DC | PRN

## 2023-04-10 MED ORDER — METOCLOPRAMIDE HCL 5 MG/ML IJ SOLN
10.0000 mg | Freq: Once | INTRAMUSCULAR | Status: AC
  Administered 2023-04-10: 10 mg via INTRAVENOUS
  Filled 2023-04-10: qty 2

## 2023-04-10 NOTE — Discharge Instructions (Signed)
You were seen in the emergency department today for evaluation of your nausea and vomiting.  I am glad you are feeling better after receiving medicine here.  I sent a prescription for Reglan to your pharmacy that you can take as needed for nausea.  Follow with your primary care doctor within a few days for reevaluation.  Return to the ER for any new or worsening symptoms.

## 2023-04-10 NOTE — ED Triage Notes (Signed)
Patient to ED via POV for N/V x3 days. Denies abd pain. 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

## 2023-04-10 NOTE — ED Provider Notes (Signed)
[facility] Provider Note    Event Date/Time   First MD Initiated Contact with Patient 04/10/23 1340     (approximate)   History   Emesis   HPI  Angola T Kooyman is a 52 y.o. male with history of diabetes presenting to the emergency department for vomiting for the past 3 days.  No associated abdominal pain.  Reports he has had similar symptoms previously, they usually last for a few hours, not for several days.  No fevers or chills.  No known sick contacts.  No head trauma or significant headache.  No dysuria or urinary frequency.      Physical Exam   Triage Vital Signs: ED Triage Vitals  Encounter Vitals Group     BP 04/10/23 1318 (!) 185/109     Systolic BP Percentile --      Diastolic BP Percentile --      Pulse Rate 04/10/23 1318 (!) 103     Resp 04/10/23 1318 18     Temp 04/10/23 1318 99.5 F (37.5 C)     Temp Source 04/10/23 1318 Oral     SpO2 04/10/23 1318 100 %     Weight 04/10/23 1316 210 lb (95.3 kg)     Height 04/10/23 1316 5\' 10"  (1.778 m)     Head Circumference --      Peak Flow --      Pain Score 04/10/23 1316 0     Pain Loc --      Pain Education --      Exclude from Growth Chart --     Most recent vital signs: Vitals:   04/10/23 1500 04/10/23 1600  BP: (!) 171/106 (!) 191/128  Pulse: 89 (!) 101  Resp: 18 18  Temp:    SpO2: 99% 98%     General: Awake, interactive  CV:  Regular rate, good peripheral perfusion.  Resp:  Lungs clear, unlabored respirations.  Abd:  Soft, nondistended, nontender to palpation diffusely Neuro:  Symmetric facial movement, fluid speech   ED Results / Procedures / Treatments   Labs (all labs ordered are listed, but only abnormal results are displayed) Labs Reviewed  COMPREHENSIVE METABOLIC PANEL - Abnormal; Notable for the following components:      Result Value   Glucose, Bld 114 (*)    BUN 21 (*)    Creatinine, Ser 1.41 (*)    Total Protein 8.5 (*)    Total Bilirubin 2.6 (*)     GFR, Estimated 60 (*)    All other components within normal limits  CBC - Abnormal; Notable for the following components:   RBC 6.09 (*)    Hemoglobin 17.2 (*)    HCT 52.5 (*)    All other components within normal limits  LIPASE, BLOOD     EKG EKG independently reviewed interpreted by myself (ER attending) demonstrates:    RADIOLOGY Imaging independently reviewed and interpreted by myself demonstrates:    PROCEDURES:  Critical Care performed: No  Procedures   MEDICATIONS ORDERED IN ED: Medications  ondansetron (ZOFRAN-ODT) disintegrating tablet 4 mg (4 mg Oral Given 04/10/23 1320)  lactated ringers bolus 1,000 mL (0 mLs Intravenous Stopped 04/10/23 1530)  metoCLOPramide (REGLAN) injection 10 mg (10 mg Intravenous Given 04/10/23 1411)  metoCLOPramide (REGLAN) injection 10 mg (10 mg Intravenous Given 04/10/23 1532)     IMPRESSION / MDM / ASSESSMENT AND PLAN / ED COURSE  I reviewed the triage vital signs and the nursing notes.  Differential diagnosis  includes, but is not limited to, viral GI illness, electrolyte abnormality, anemia, low suspicion for acute intra-abdominal process given reassuring abdominal exam  Patient's presentation is most consistent with acute complicated illness / injury requiring diagnostic workup.  52 year old male presenting to the emergency department for evaluation of vomiting.  Abdominal exam reassuring.  Mild tachycardia on presentation.  Labs notable for elevated creatinine at 1.41, no recent prior for comparison.  Received Zofran in triage without significant benefit.  Was given a dose of Reglan with significant improvement in his symptoms, but did have 1 episode of recurrent vomiting.  Given a second dose without recurrent vomiting.  Was able to tolerate a p.o. trial.  Ignacia Bayley to be discharged home.  Do think this is reasonable given improvement in symptoms after medication.  Will DC with prescription for Reglan.  Strict return precautions provided.   Patient discharged in stable condition.     FINAL CLINICAL IMPRESSION(S) / ED DIAGNOSES   Final diagnoses:  Vomiting without nausea, unspecified vomiting type  Dehydration     Rx / DC Orders   ED Discharge Orders          Ordered    metoCLOPramide (REGLAN) 10 MG tablet  Every 8 hours PRN        04/10/23 1635             Note:  This document was prepared using Dragon voice recognition software and may include unintentional dictation errors.   Trinna Post, MD 04/10/23 [phone]

## 2023-07-20 DIAGNOSIS — E113393 Type 2 diabetes mellitus with moderate nonproliferative diabetic retinopathy without macular edema, bilateral: Secondary | ICD-10-CM | POA: Diagnosis not present

## 2023-07-20 LAB — HM DIABETES EYE EXAM

## 2023-08-11 DIAGNOSIS — S90222A Contusion of left lesser toe(s) with damage to nail, initial encounter: Secondary | ICD-10-CM | POA: Diagnosis not present

## 2023-08-12 ENCOUNTER — Ambulatory Visit: Payer: Self-pay

## 2023-08-19 ENCOUNTER — Observation Stay
Admission: EM | Admit: 2023-08-19 | Discharge: 2023-08-20 | Disposition: A | Discharge status: Home / Self Care | Payer: BC Managed Care – PPO | Attending: Internal Medicine | Admitting: Internal Medicine

## 2023-08-19 ENCOUNTER — Observation Stay: Payer: BC Managed Care – PPO

## 2023-08-19 ENCOUNTER — Other Ambulatory Visit: Payer: Self-pay

## 2023-08-19 ENCOUNTER — Emergency Department: Payer: BC Managed Care – PPO

## 2023-08-19 ENCOUNTER — Encounter: Payer: Self-pay | Admitting: Emergency Medicine

## 2023-08-19 DIAGNOSIS — D72829 Elevated white blood cell count, unspecified: Secondary | ICD-10-CM | POA: Insufficient documentation

## 2023-08-19 DIAGNOSIS — K851 Biliary acute pancreatitis without necrosis or infection: Secondary | ICD-10-CM

## 2023-08-19 DIAGNOSIS — E119 Type 2 diabetes mellitus without complications: Secondary | ICD-10-CM

## 2023-08-19 DIAGNOSIS — R1013 Epigastric pain: Principal | ICD-10-CM

## 2023-08-19 DIAGNOSIS — K760 Fatty (change of) liver, not elsewhere classified: Secondary | ICD-10-CM | POA: Diagnosis not present

## 2023-08-19 DIAGNOSIS — N281 Cyst of kidney, acquired: Secondary | ICD-10-CM | POA: Diagnosis not present

## 2023-08-19 DIAGNOSIS — R112 Nausea with vomiting, unspecified: Secondary | ICD-10-CM | POA: Diagnosis not present

## 2023-08-19 DIAGNOSIS — F1090 Alcohol use, unspecified, uncomplicated: Secondary | ICD-10-CM | POA: Insufficient documentation

## 2023-08-19 DIAGNOSIS — K828 Other specified diseases of gallbladder: Secondary | ICD-10-CM | POA: Diagnosis not present

## 2023-08-19 DIAGNOSIS — K3184 Gastroparesis: Secondary | ICD-10-CM | POA: Insufficient documentation

## 2023-08-19 DIAGNOSIS — E1140 Type 2 diabetes mellitus with diabetic neuropathy, unspecified: Secondary | ICD-10-CM | POA: Diagnosis not present

## 2023-08-19 DIAGNOSIS — K859 Acute pancreatitis without necrosis or infection, unspecified: Principal | ICD-10-CM | POA: Insufficient documentation

## 2023-08-19 DIAGNOSIS — E785 Hyperlipidemia, unspecified: Secondary | ICD-10-CM | POA: Insufficient documentation

## 2023-08-19 DIAGNOSIS — Z79899 Other long term (current) drug therapy: Secondary | ICD-10-CM | POA: Insufficient documentation

## 2023-08-19 DIAGNOSIS — R109 Unspecified abdominal pain: Secondary | ICD-10-CM | POA: Diagnosis not present

## 2023-08-19 DIAGNOSIS — E1143 Type 2 diabetes mellitus with diabetic autonomic (poly)neuropathy: Secondary | ICD-10-CM | POA: Insufficient documentation

## 2023-08-19 DIAGNOSIS — N200 Calculus of kidney: Secondary | ICD-10-CM | POA: Diagnosis not present

## 2023-08-19 DIAGNOSIS — K8020 Calculus of gallbladder without cholecystitis without obstruction: Secondary | ICD-10-CM | POA: Diagnosis not present

## 2023-08-19 DIAGNOSIS — Z794 Long term (current) use of insulin: Secondary | ICD-10-CM | POA: Insufficient documentation

## 2023-08-19 DIAGNOSIS — R1110 Vomiting, unspecified: Secondary | ICD-10-CM

## 2023-08-19 LAB — LIPID PANEL
Cholesterol: 198 mg/dL (ref 0–200)
HDL: 50 mg/dL (ref >40)
LDL Cholesterol: 133 mg/dL — ABNORMAL HIGH (ref 0–99)
Total CHOL/HDL Ratio: 4 {ratio}
Triglycerides: 77 mg/dL (ref <150)
VLDL: 15 mg/dL (ref 0–40)

## 2023-08-19 LAB — COMPREHENSIVE METABOLIC PANEL
ALT: 103 U/L — ABNORMAL HIGH (ref 0–44)
AST: 68 U/L — ABNORMAL HIGH (ref 15–41)
Albumin: 4.3 g/dL (ref 3.5–5.0)
Alkaline Phosphatase: 100 U/L (ref 38–126)
Anion gap: 17 — ABNORMAL HIGH (ref 5–15)
BUN: 19 mg/dL (ref 6–20)
CO2: 25 mmol/L (ref 22–32)
Calcium: 9.8 mg/dL (ref 8.9–10.3)
Chloride: 97 mmol/L — ABNORMAL LOW (ref 98–111)
Creatinine, Ser: 1.19 mg/dL (ref 0.61–1.24)
GFR, Estimated: >60 mL/min (ref >60)
Glucose, Bld: 165 mg/dL — ABNORMAL HIGH (ref 70–99)
Potassium: 3.4 mmol/L — ABNORMAL LOW (ref 3.5–5.1)
Sodium: 139 mmol/L (ref 135–145)
Total Bilirubin: 1.8 mg/dL — ABNORMAL HIGH (ref 0.0–1.2)
Total Protein: 8.1 g/dL (ref 6.5–8.1)

## 2023-08-19 LAB — URINALYSIS, ROUTINE W REFLEX MICROSCOPIC
Bacteria, UA: NONE SEEN
Bilirubin Urine: NEGATIVE
Glucose, UA: 50 mg/dL — AB
Ketones, ur: 5 mg/dL — AB
Leukocytes,Ua: NEGATIVE
Nitrite: NEGATIVE
Protein, ur: 100 mg/dL — AB
Specific Gravity, Urine: >1.046 — ABNORMAL HIGH (ref 1.005–1.030)
Squamous Epithelial / HPF: 0 /[HPF] (ref 0–5)
pH: 6 (ref 5.0–8.0)

## 2023-08-19 LAB — CBC WITH DIFFERENTIAL/PLATELET
Abs Immature Granulocytes: 0.07 10*3/uL (ref 0.00–0.07)
Basophils Absolute: 0 10*3/uL (ref 0.0–0.1)
Basophils Relative: 0 %
Eosinophils Absolute: 0 10*3/uL (ref 0.0–0.5)
Eosinophils Relative: 0 %
HCT: 54.6 % — ABNORMAL HIGH (ref 39.0–52.0)
Hemoglobin: 17.8 g/dL — ABNORMAL HIGH (ref 13.0–17.0)
Immature Granulocytes: 1 %
Lymphocytes Relative: 7 %
Lymphs Abs: 1 10*3/uL (ref 0.7–4.0)
MCH: 28.9 pg (ref 26.0–34.0)
MCHC: 32.6 g/dL (ref 30.0–36.0)
MCV: 88.8 fL (ref 80.0–100.0)
Monocytes Absolute: 0.8 10*3/uL (ref 0.1–1.0)
Monocytes Relative: 6 %
Neutro Abs: 11.9 10*3/uL — ABNORMAL HIGH (ref 1.7–7.7)
Neutrophils Relative %: 86 %
Platelets: 353 10*3/uL (ref 150–400)
RBC: 6.15 MIL/uL — ABNORMAL HIGH (ref 4.22–5.81)
RDW: 13.3 % (ref 11.5–15.5)
WBC: 13.9 10*3/uL — ABNORMAL HIGH (ref 4.0–10.5)
nRBC: 0 % (ref 0.0–0.2)

## 2023-08-19 LAB — TROPONIN I (HIGH SENSITIVITY)
Troponin I (High Sensitivity): 14 ng/L (ref <18)
Troponin I (High Sensitivity): 15 ng/L (ref <18)

## 2023-08-19 LAB — LACTIC ACID, PLASMA
Lactic Acid, Venous: 1.5 mmol/L (ref 0.5–1.9)
Lactic Acid, Venous: 1.5 mmol/L (ref 0.5–1.9)

## 2023-08-19 LAB — CBG MONITORING, ED
Glucose-Capillary: 160 mg/dL — ABNORMAL HIGH (ref 70–99)
Glucose-Capillary: 178 mg/dL — ABNORMAL HIGH (ref 70–99)

## 2023-08-19 LAB — GLUCOSE, CAPILLARY
Glucose-Capillary: 153 mg/dL — ABNORMAL HIGH (ref 70–99)
Glucose-Capillary: 130 mg/dL — ABNORMAL HIGH (ref 70–99)

## 2023-08-19 LAB — LIPASE, BLOOD: Lipase: 158 U/L — ABNORMAL HIGH (ref 11–51)

## 2023-08-19 MED ORDER — GABAPENTIN 300 MG PO CAPS
600.0000 mg | ORAL_CAPSULE | Freq: Three times a day (TID) | ORAL | Status: DC
  Administered 2023-08-19 – 2023-08-20 (×2): 600 mg via ORAL
  Filled 2023-08-19 (×2): qty 2

## 2023-08-19 MED ORDER — SODIUM CHLORIDE 0.9 % IV SOLN
8.0000 mg | Freq: Once | INTRAVENOUS | Status: DC
  Filled 2023-08-19: qty 4

## 2023-08-19 MED ORDER — ONDANSETRON HCL 4 MG/2ML IJ SOLN
4.0000 mg | Freq: Once | INTRAMUSCULAR | Status: AC
  Administered 2023-08-19: 4 mg via INTRAVENOUS
  Filled 2023-08-19: qty 2

## 2023-08-19 MED ORDER — HYDROMORPHONE HCL 1 MG/ML IJ SOLN: 0.5000 mg | INTRAMUSCULAR | Status: DC | PRN

## 2023-08-19 MED ORDER — CARVEDILOL 6.25 MG PO TABS
6.2500 mg | ORAL_TABLET | Freq: Two times a day (BID) | ORAL | Status: DC
  Administered 2023-08-20: 6.25 mg via ORAL
  Filled 2023-08-19: qty 1

## 2023-08-19 MED ORDER — ONDANSETRON HCL 4 MG/2ML IJ SOLN
4.0000 mg | Freq: Once | INTRAMUSCULAR | Status: AC
  Administered 2023-08-19: 4 mg via INTRAVENOUS

## 2023-08-19 MED ORDER — ONDANSETRON HCL 4 MG/2ML IJ SOLN
4.0000 mg | Freq: Four times a day (QID) | INTRAMUSCULAR | Status: DC | PRN
  Administered 2023-08-19 – 2023-08-20 (×2): 4 mg via INTRAVENOUS
  Filled 2023-08-19 (×3): qty 2

## 2023-08-19 MED ORDER — CARVEDILOL 6.25 MG PO TABS
3.1250 mg | ORAL_TABLET | Freq: Two times a day (BID) | ORAL | Status: DC
  Administered 2023-08-19: 3.125 mg via ORAL
  Filled 2023-08-19: qty 1

## 2023-08-19 MED ORDER — SODIUM CHLORIDE 0.9 % IV SOLN
25.0000 mg | Freq: Once | INTRAVENOUS | Status: AC
  Administered 2023-08-19: 25 mg via INTRAVENOUS
  Filled 2023-08-19: qty 25

## 2023-08-19 MED ORDER — PIPERACILLIN-TAZOBACTAM 3.375 G IVPB
3.3750 g | Freq: Once | INTRAVENOUS | Status: AC
  Administered 2023-08-19: 3.375 g via INTRAVENOUS
  Filled 2023-08-19: qty 50

## 2023-08-19 MED ORDER — ACETAMINOPHEN 325 MG PO TABS: 650.0000 mg | ORAL_TABLET | Freq: Four times a day (QID) | ORAL | Status: DC | PRN

## 2023-08-19 MED ORDER — ATORVASTATIN CALCIUM 20 MG PO TABS
20.0000 mg | ORAL_TABLET | Freq: Every day | ORAL | Status: DC
  Administered 2023-08-20: 20 mg via ORAL
  Filled 2023-08-19: qty 1

## 2023-08-19 MED ORDER — PANTOPRAZOLE SODIUM 40 MG PO TBEC
40.0000 mg | DELAYED_RELEASE_TABLET | Freq: Every day | ORAL | Status: DC
  Administered 2023-08-19 – 2023-08-20 (×2): 40 mg via ORAL
  Filled 2023-08-19 (×2): qty 1

## 2023-08-19 MED ORDER — CARVEDILOL 6.25 MG PO TABS
3.1250 mg | ORAL_TABLET | Freq: Once | ORAL | Status: AC
  Administered 2023-08-19: 3.125 mg via ORAL
  Filled 2023-08-19: qty 1

## 2023-08-19 MED ORDER — INSULIN ASPART 100 UNIT/ML IJ SOLN
0.0000 [IU] | Freq: Three times a day (TID) | INTRAMUSCULAR | Status: DC
  Administered 2023-08-19: 3 [IU] via SUBCUTANEOUS
  Filled 2023-08-19: qty 1

## 2023-08-19 MED ORDER — LACTATED RINGERS IV BOLUS
1000.0000 mL | Freq: Once | INTRAVENOUS | Status: AC
  Administered 2023-08-19: 1000 mL via INTRAVENOUS

## 2023-08-19 MED ORDER — IOHEXOL 300 MG/ML  SOLN
100.0000 mL | Freq: Once | INTRAMUSCULAR | Status: AC | PRN
  Administered 2023-08-19: 100 mL via INTRAVENOUS

## 2023-08-19 MED ORDER — LORAZEPAM 0.5 MG PO TABS: 0.5000 mg | ORAL_TABLET | Freq: Four times a day (QID) | ORAL | Status: DC | PRN

## 2023-08-19 MED ORDER — ONDANSETRON HCL 4 MG/2ML IJ SOLN
4.0000 mg | Freq: Once | INTRAMUSCULAR | Status: DC
  Filled 2023-08-19: qty 2

## 2023-08-19 MED ORDER — INSULIN ASPART 100 UNIT/ML IJ SOLN: 0.0000 [IU] | Freq: Every day | INTRAMUSCULAR | Status: DC

## 2023-08-19 MED ORDER — ACETAMINOPHEN 650 MG RE SUPP: 650.0000 mg | Freq: Four times a day (QID) | RECTAL | Status: DC | PRN

## 2023-08-19 MED ORDER — HYDROMORPHONE HCL 1 MG/ML IJ SOLN
1.0000 mg | Freq: Once | INTRAMUSCULAR | Status: AC
  Administered 2023-08-19: 1 mg via INTRAVENOUS
  Filled 2023-08-19: qty 1

## 2023-08-19 NOTE — Plan of Care (Signed)

## 2023-08-19 NOTE — ED Triage Notes (Addendum)
 Patient ambulatory to triage with steady gait, without difficulty or distress noted; pt reports x 2 days having SHOB, abd cramping, N/V and weakness; st his FSBS have been "normal" at home

## 2023-08-19 NOTE — ED Provider Notes (Signed)
 [facility] Provider Note   Event Date/Time   First MD Initiated Contact with Patient 08/19/23 [phone]     (approximate) History  Emesis  HPI [name] [name] is a 53 y.o. male with a past medical history of type 2 diabetes, migraines, and hyperlipidemia who presents complaining of of right upper quadrant abdominal pain with radiation into the epigastric region and associated shortness of breath, cramping, nausea vomiting, and generalized weakness.  Patient denies any recent travel or sick contacts.  Patient denies any exacerbating or relieving factors. ROS: Patient currently denies any vision changes, tinnitus, difficulty speaking, facial droop, sore throat, chest pain, shortness of breath, diarrhea, dysuria, or numbness/paresthesias in any extremity   Physical Exam  Triage Vital Signs: ED Triage Vitals  Encounter Vitals Group     BP 08/19/23 0654 (!) 164/124     Systolic BP Percentile --      Diastolic BP Percentile --      Pulse Rate 08/19/23 0654 (!) 107     Resp 08/19/23 0654 (!) 21     Temp 08/19/23 0654 98.5 F (36.9 C)     Temp Source 08/19/23 0654 Oral     SpO2 08/19/23 0654 98 %     Weight 08/19/23 0655 200 lb (90.7 kg)     Height 08/19/23 0655 5' 10 (1.778 m)     Head Circumference --      Peak Flow --      Pain Score 08/19/23 0655 6     Pain Loc --      Pain Education --      Exclude from Growth Chart --    Most recent vital signs: Vitals:   08/19/23 1000 08/19/23 1139  BP: (!) 169/101   Pulse: 90   Resp: 18   Temp:  98.3 F (36.8 C)  SpO2: 98%    General: Awake, oriented x4. CV:  Good peripheral perfusion.  Resp:  Normal effort.  Abd:  No distention.  Epigastric tenderness to palpation Other:  Middle-aged overweight African-American male resting comfortably in no acute distress ED Results / Procedures / Treatments  Labs (all labs ordered are listed, but only abnormal results are displayed) Labs Reviewed  CBC WITH  DIFFERENTIAL/PLATELET - Abnormal; Notable for the following components:      Result Value   WBC 13.9 (*)    RBC 6.15 (*)    Hemoglobin 17.8 (*)    HCT 54.6 (*)    Neutro Abs 11.9 (*)    All other components within normal limits  COMPREHENSIVE METABOLIC PANEL - Abnormal; Notable for the following components:   Potassium 3.4 (*)    Chloride 97 (*)    Glucose, Bld 165 (*)    AST 68 (*)    ALT 103 (*)    Total Bilirubin 1.8 (*)    Anion gap 17 (*)    All other components within normal limits  URINALYSIS, ROUTINE W REFLEX MICROSCOPIC - Abnormal; Notable for the following components:   Color, Urine YELLOW (*)    APPearance CLEAR (*)    Specific Gravity, Urine >1.046 (*)    Glucose, UA 50 (*)    Hgb urine dipstick MODERATE (*)    Ketones, ur 5 (*)    Protein, ur 100 (*)    All other components within normal limits  LIPASE, BLOOD - Abnormal; Notable for the following components:   Lipase 158 (*)    All other components within normal limits  CBG MONITORING,  ED - Abnormal; Notable for the following components:   Glucose-Capillary 160 (*)    All other components within normal limits  CULTURE, BLOOD (ROUTINE X 2)  CULTURE, BLOOD (ROUTINE X 2)  LACTIC ACID, PLASMA  LACTIC ACID, PLASMA  LIPID PANEL  IGG 1, 2, 3, AND 4  TROPONIN I (HIGH SENSITIVITY)  TROPONIN I (HIGH SENSITIVITY)   EKG ED ECG REPORT I, Artist MARLA Kerns, the attending physician, personally viewed and interpreted this ECG. Date: 08/19/2023 EKG Time: 0650 Rate: 98 Rhythm: normal sinus rhythm QRS Axis: normal Intervals: normal ST/T Wave abnormalities: normal Narrative Interpretation: no evidence of acute ischemia RADIOLOGY ED MD interpretation: CT of the abdomen pelvis with IV contrast interpreted independently and shows no evidence of acute abnormalities  Ultrasound of the right upper quadrant interpreted independently and shows no indications of of acute abnormalities -Agree with radiology assessment Official  radiology report(s): US  ABDOMEN LIMITED RUQ (LIVER/GB) Result Date: 08/19/2023 CLINICAL DATA:  Pancreatitis EXAM: ULTRASOUND ABDOMEN LIMITED RIGHT UPPER QUADRANT COMPARISON:  CT earlier 08/19/2023 FINDINGS: Gallbladder: Distended gallbladder with dependent stones. No wall thickening or adjacent fluid. No reported sonographic Murphy's sign. Common bile duct: Diameter: 4 mm Liver: Diffusely echogenic hepatic parenchyma consistent with fatty liver infiltration. Portal vein is patent on color Doppler imaging with normal direction of blood flow towards the liver. Other: Please correlate with separate CT scan. IMPRESSION: Fatty liver infiltration. Gallstones. No further sonographic evidence of acute cholecystitis. No biliary ductal dilatation. Electronically Signed   By: Ranell Bring M.D.   On: 08/19/2023 11:26   CT ABDOMEN PELVIS W CONTRAST Result Date: 08/19/2023 CLINICAL DATA:  Pancreatitis, acute, severe. Shortness of breath. Abdominal cramping. Nausea/vomiting and weakness. EXAM: CT ABDOMEN AND PELVIS WITH CONTRAST TECHNIQUE: Multidetector CT imaging of the abdomen and pelvis was performed using the standard protocol following bolus administration of intravenous contrast. RADIATION DOSE REDUCTION: This exam was performed according to the departmental dose-optimization program which includes automated exposure control, adjustment of the mA and/or kV according to patient size and/or use of iterative reconstruction technique. CONTRAST:  OMNIPAQUE  IOHEXOL  300 MG/ML  SOLN COMPARISON:  CT scan abdomen and pelvis. FINDINGS: Lower chest: The lung bases are clear. No pleural effusion. The heart is normal in size. No pericardial effusion. Hepatobiliary: The liver is normal in size. Non-cirrhotic configuration. No suspicious mass. These is mild diffuse hepatic steatosis. No intrahepatic or extrahepatic bile duct dilation. No calcified gallstones. Normal gallbladder wall thickness. No pericholecystic inflammatory  changes. Pancreas: Unremarkable. No pancreatic ductal dilatation or surrounding inflammatory changes. Spleen: Within normal limits. No focal lesion. Adrenals/Urinary Tract: Adrenal glands are unremarkable. No suspicious renal mass. There are simple renal cysts, 1 each in bilateral kidneys with largest measuring up to 2.0 x 2.1 cm in the left kidney. There are at least 5, sub 4 mm nonobstructing calculi in the left kidney and at least 2, sub 4 mm nonobstructing calculi in the right kidney. No ureterolithiasis. No hydroureteronephrosis. Unremarkable urinary bladder. Stomach/Bowel: No disproportionate dilation of the small or large bowel loops. No evidence of abnormal bowel wall thickening or inflammatory changes. The appendix is unremarkable. Vascular/Lymphatic: No ascites or pneumoperitoneum. No abdominal or pelvic lymphadenopathy, by size criteria. No aneurysmal dilation of the major abdominal arteries. There are mild peripheral atherosclerotic vascular calcifications of the aorta and its major branches. Reproductive: Normal size prostate. Symmetric seminal vesicles. Penile implant noted with its reservoir in the right lower anterior pelvis region. Other: There is a tiny fat containing umbilical hernia. There also  small bilateral fat containing inguinal hernias. The soft tissues and abdominal wall are otherwise unremarkable. Musculoskeletal: No suspicious osseous lesions. There are mild multilevel degenerative changes in the visualized spine. IMPRESSION: 1. No acute inflammatory process identified within the abdomen or pelvis. No CT imaging signs of acute pancreatitis. 2. Bilateral sub-4 mm nonobstructing renal calculi as described above. 3. Multiple other nonacute observations, as described above. Electronically Signed   By: Ree Molt M.D.   On: 08/19/2023 09:49   PROCEDURES: Critical Care performed: No .1-3 Lead EKG Interpretation  Performed by: Jossie Artist POUR, MD Authorized by: Jossie Artist POUR, MD      Interpretation: normal     ECG rate:  71   ECG rate assessment: normal     Rhythm: sinus rhythm     Ectopy: none     Conduction: normal    MEDICATIONS ORDERED IN ED: Medications  piperacillin -tazobactam (ZOSYN ) IVPB 3.375 g (3.375 g Intravenous New Bag/Given 08/19/23 1217)  promethazine  (PHENERGAN ) 25 mg in sodium chloride  0.9 % 50 mL IVPB (25 mg Intravenous New Bag/Given 08/19/23 1246)  LORazepam  (ATIVAN ) tablet 0.5 mg (has no administration in time range)  lactated ringers  bolus 1,000 mL (0 mLs Intravenous Stopped 08/19/23 1145)  HYDROmorphone  (DILAUDID ) injection 1 mg (1 mg Intravenous Given 08/19/23 0821)  ondansetron  (ZOFRAN ) injection 4 mg (4 mg Intravenous Given 08/19/23 0824)    Followed by  ondansetron  (ZOFRAN ) injection 4 mg (4 mg Intravenous Given 08/19/23 0821)  iohexol  (OMNIPAQUE ) 300 MG/ML solution 100 mL (100 mLs Intravenous Contrast Given 08/19/23 0834)  ondansetron  (ZOFRAN ) injection 4 mg (4 mg Intravenous Given 08/19/23 1215)   IMPRESSION / MDM / ASSESSMENT AND PLAN / ED COURSE  I reviewed the triage vital signs and the nursing notes.                             The patient is on the cardiac monitor to evaluate for evidence of arrhythmia and/or significant heart rate changes. Patient's presentation is most consistent with acute presentation with potential threat to life or bodily function.  This patient presents to the ED for concern of abdominal pain with nausea and vomiting, this involves an extensive number of treatment options, and is a complaint that carries with it a high risk of complications and morbidity.  The differential diagnosis includes cholecystitis, ascending cholangitis, appendicitis, diverticulitis, gastroenteritis Co morbidities that complicate the patient evaluation  Type 2 diabetes, hyperlipidemia Additional history obtained:  Additional history obtained from caregiver at bedside  External records from outside source obtained and reviewed including ER  visit on 04/10/2023 Lab Tests:  I Ordered, and personally interpreted labs.  The pertinent results include: WBC 13.9, lipase 158, AST 68, ALT 103, T. bili 1.8 Imaging Studies ordered:  I ordered imaging studies including CT of the abdomen and pelvis and right upper quadrant ultrasound  I independently visualized and interpreted imaging which showed no evidence of acute abnormalities  I agree with the radiologist interpretation Cardiac Monitoring: / EKG:  The patient was maintained on a cardiac monitor.  I personally viewed and interpreted the cardiac monitored which showed an underlying rhythm of: Normal sinus rhythm Consultations Obtained:  I requested consultation with the hospitalist,  and discussed lab and imaging findings as well as pertinent plan - they recommend: Admission Problem List / ED Course / Critical interventions / Medication management  Intractable nausea and vomiting, pancreatitis  I ordered medication including pain control/analgesia for pain and  nausea  Reevaluation of the patient after these medicines showed that the patient improved  I have reviewed the patients home medicines and have made adjustments as needed  Dispo: Admit to medicine       FINAL CLINICAL IMPRESSION(S) / ED DIAGNOSES   Final diagnoses:  Epigastric pain  Uncontrollable vomiting   Rx / DC Orders   ED Discharge Orders     None      Note:  This document was prepared using Dragon voice recognition software and may include unintentional dictation errors.   Mohmmad Saleeby K, MD 08/19/23 1255

## 2023-08-19 NOTE — H&P (Signed)
 History and Physical    [name] [name] FMW:[id number] DOB: 10-Feb-1971 DOA: 08/19/2023  PCP: System, Provider Not In (Confirm with patient/family/NH records and if not entered, this has to be entered at [facility] point of entry) Patient coming from: Home  I have personally briefly reviewed patient's old medical records in [facility]  Chief Complaint: Nauseous vomiting abdominal pain  HPI: [name] [name] is a 53 y.o. male with medical history significant of IDDM, HLD, diabetic gastroparesis, diabetic neuropathy, presented with uncontrolled nauseous vomiting abdominal pain.  Symptoms started 3 days ago, initially symptoms involved repeated nauseous vomiting which patient attributed to poorly controlled gastroparesis.  Then patient started develop cramping-like epigastric pain, and worsening of nauseous vomiting stomach content nonbloody none bile denied any diarrhea no fever or chills.  Patient was diagnosed with gastroparesis 3 years ago was briefly on Reglan  then switched to azithromycin 500 mg daily due to severe side effect of tremors developed well on Reglan .  Denied any new medications.  No history of gallstones. Afebrile, ED Course: Afebrile, blood pressure elevated nonhypoxic.  CT abdominal pelvis negative for acute findings blood work showed lipase 183, hemoglobin 17.8, WBC 13.9, K3.4, AST 68, ALT 103, total bilirubin 1.8.  Patient was given IV bolus and Zosyn  in the ED and Zofran .  Review of Systems: As per HPI otherwise 14 point review of systems negative.    Past Medical History:  Diagnosis Date   Diabetes mellitus type II, uncontrolled    diagnosed age 29yo; week long hospitalization at diagnosis   Hyperlipidemia    Insomnia    Microalbuminuria    Migraine    Unspecified disorder of skin and subcutaneous tissue    recurrent summertime rash, large lesions scattered?   Wears glasses     Past Surgical History:  Procedure Laterality Date   VASECTOMY       reports that he  has never smoked. He does not have any smokeless tobacco history on file. He reports current alcohol use. He reports that he does not use drugs.  Allergies  Allergen Reactions   Metformin  Nausea And Vomiting    Family History  Problem Relation Age of Onset   Diabetes Maternal Aunt    Diabetes Maternal Uncle    Diabetes Maternal Grandmother    Heart disease Neg Hx    Stroke Neg Hx    Cancer Neg Hx      Prior to Admission medications   Medication Sig Start Date End Date Taking? Authorizing Provider  atorvastatin  (LIPITOR) 20 MG tablet Take 20 mg by mouth daily.    [provider]  cholecalciferol (VITAMIN D ) 1000 UNITS tablet Take 1,000 Units by mouth daily.    [provider]  gabapentin  (NEURONTIN ) 600 MG tablet Take 1 tablet (600 mg total) by mouth 3 (three) times daily. 01/15/15   Athar, Saima, MD  metoCLOPramide  (REGLAN ) 10 MG tablet Take 2 tablets (20 mg total) by mouth every 8 (eight) hours as needed for nausea. 04/10/23 05/10/23  Levander Slate, MD    Physical Exam: Vitals:   08/19/23 0830 08/19/23 1000 08/19/23 1139 08/19/23 1310  BP: (!) 171/88 (!) 169/101  (!) 171/90  Pulse: 88 90  91  Resp: 18 18  18   Temp:   98.3 F (36.8 C)   TempSrc:   Oral   SpO2: 98% 98%  97%  Weight:      Height:        Constitutional: NAD, calm, comfortable Vitals:   08/19/23 0830  08/19/23 1000 08/19/23 1139 08/19/23 1310  BP: (!) 171/88 (!) 169/101  (!) 171/90  Pulse: 88 90  91  Resp: 18 18  18   Temp:   98.3 F (36.8 C)   TempSrc:   Oral   SpO2: 98% 98%  97%  Weight:      Height:       Eyes: PERRL, lids and conjunctivae normal ENMT: Mucous membranes are dry. Posterior pharynx clear of any exudate or lesions.Normal dentition.  Neck: normal, supple, no masses, no thyromegaly Respiratory: clear to auscultation bilaterally, no wheezing, no crackles. Normal respiratory effort. No accessory muscle use.  Cardiovascular: Regular rate and rhythm, no murmurs / rubs / gallops.  No extremity edema. 2+ pedal pulses. No carotid bruits.  Abdomen: Mild tenderness on deep palpation on epigastric area, no rebound no guarding, no masses palpated. No hepatosplenomegaly. Bowel sounds positive.  Musculoskeletal: no clubbing / cyanosis. No joint deformity upper and lower extremities. Good ROM, no contractures. Normal muscle tone.  Skin: no rashes, lesions, ulcers. No induration Neurologic: CN 2-12 grossly intact. Sensation intact, DTR normal. Strength 5/5 in all 4.  Psychiatric: Normal judgment and insight. Alert and oriented x 3. Normal mood.     Labs on Admission: I have personally reviewed following labs and imaging studies  CBC: Recent Labs  Lab 08/19/23 0704  WBC 13.9*  NEUTROABS 11.9*  HGB 17.8*  HCT 54.6*  MCV 88.8  PLT 353   Basic Metabolic Panel: Recent Labs  Lab 08/19/23 0704  NA 139  K 3.4*  CL 97*  CO2 25  GLUCOSE 165*  BUN 19  CREATININE 1.19  CALCIUM  9.8   GFR: Estimated Creatinine Clearance: 82.3 mL/min (by C-G formula based on SCr of 1.19 mg/dL). Liver Function Tests: Recent Labs  Lab 08/19/23 0704  AST 68*  ALT 103*  ALKPHOS 100  BILITOT 1.8*  PROT 8.1  ALBUMIN 4.3   Recent Labs  Lab 08/19/23 0704  LIPASE 158*   No results for input(s): AMMONIA in the last 168 hours. Coagulation Profile: No results for input(s): INR, PROTIME in the last 168 hours. Cardiac Enzymes: No results for input(s): CKTOTAL, CKMB, CKMBINDEX, TROPONINI in the last 168 hours. BNP (last 3 results) No results for input(s): PROBNP in the last 8760 hours. HbA1C: No results for input(s): HGBA1C in the last 72 hours. CBG: Recent Labs  Lab 08/19/23 0700  GLUCAP 160*   Lipid Profile: No results for input(s): CHOL, HDL, LDLCALC, TRIG, CHOLHDL, LDLDIRECT in the last 72 hours. Thyroid  Function Tests: No results for input(s): TSH, T4TOTAL, FREET4, T3FREE, THYROIDAB in the last 72 hours. Anemia Panel: No results for  input(s): VITAMINB12, FOLATE, FERRITIN, TIBC, IRON, RETICCTPCT in the last 72 hours. Urine analysis:    Component Value Date/Time   COLORURINE YELLOW (A) 08/19/2023 1143   APPEARANCEUR CLEAR (A) 08/19/2023 1143   LABSPEC >1.046 (H) 08/19/2023 1143   PHURINE 6.0 08/19/2023 1143   GLUCOSEU 50 (A) 08/19/2023 1143   HGBUR MODERATE (A) 08/19/2023 1143   BILIRUBINUR NEGATIVE 08/19/2023 1143   BILIRUBINUR neg 07/05/2012 0945   KETONESUR 5 (A) 08/19/2023 1143   PROTEINUR 100 (A) 08/19/2023 1143   UROBILINOGEN 1.0 12/03/2012 0831   NITRITE NEGATIVE 08/19/2023 1143   LEUKOCYTESUR NEGATIVE 08/19/2023 1143    Radiological Exams on Admission: US  ABDOMEN LIMITED RUQ (LIVER/GB) Result Date: 08/19/2023 CLINICAL DATA:  Pancreatitis EXAM: ULTRASOUND ABDOMEN LIMITED RIGHT UPPER QUADRANT COMPARISON:  CT earlier 08/19/2023 FINDINGS: Gallbladder: Distended gallbladder with dependent stones. No wall thickening  or adjacent fluid. No reported sonographic Murphy's sign. Common bile duct: Diameter: 4 mm Liver: Diffusely echogenic hepatic parenchyma consistent with fatty liver infiltration. Portal vein is patent on color Doppler imaging with normal direction of blood flow towards the liver. Other: Please correlate with separate CT scan. IMPRESSION: Fatty liver infiltration. Gallstones. No further sonographic evidence of acute cholecystitis. No biliary ductal dilatation. Electronically Signed   By: Ranell Bring M.D.   On: 08/19/2023 11:26   CT ABDOMEN PELVIS W CONTRAST Result Date: 08/19/2023 CLINICAL DATA:  Pancreatitis, acute, severe. Shortness of breath. Abdominal cramping. Nausea/vomiting and weakness. EXAM: CT ABDOMEN AND PELVIS WITH CONTRAST TECHNIQUE: Multidetector CT imaging of the abdomen and pelvis was performed using the standard protocol following bolus administration of intravenous contrast. RADIATION DOSE REDUCTION: This exam was performed according to the departmental dose-optimization program  which includes automated exposure control, adjustment of the mA and/or kV according to patient size and/or use of iterative reconstruction technique. CONTRAST:  OMNIPAQUE  IOHEXOL  300 MG/ML  SOLN COMPARISON:  CT scan abdomen and pelvis. FINDINGS: Lower chest: The lung bases are clear. No pleural effusion. The heart is normal in size. No pericardial effusion. Hepatobiliary: The liver is normal in size. Non-cirrhotic configuration. No suspicious mass. These is mild diffuse hepatic steatosis. No intrahepatic or extrahepatic bile duct dilation. No calcified gallstones. Normal gallbladder wall thickness. No pericholecystic inflammatory changes. Pancreas: Unremarkable. No pancreatic ductal dilatation or surrounding inflammatory changes. Spleen: Within normal limits. No focal lesion. Adrenals/Urinary Tract: Adrenal glands are unremarkable. No suspicious renal mass. There are simple renal cysts, 1 each in bilateral kidneys with largest measuring up to 2.0 x 2.1 cm in the left kidney. There are at least 5, sub 4 mm nonobstructing calculi in the left kidney and at least 2, sub 4 mm nonobstructing calculi in the right kidney. No ureterolithiasis. No hydroureteronephrosis. Unremarkable urinary bladder. Stomach/Bowel: No disproportionate dilation of the small or large bowel loops. No evidence of abnormal bowel wall thickening or inflammatory changes. The appendix is unremarkable. Vascular/Lymphatic: No ascites or pneumoperitoneum. No abdominal or pelvic lymphadenopathy, by size criteria. No aneurysmal dilation of the major abdominal arteries. There are mild peripheral atherosclerotic vascular calcifications of the aorta and its major branches. Reproductive: Normal size prostate. Symmetric seminal vesicles. Penile implant noted with its reservoir in the right lower anterior pelvis region. Other: There is a tiny fat containing umbilical hernia. There also small bilateral fat containing inguinal hernias. The soft tissues and  abdominal wall are otherwise unremarkable. Musculoskeletal: No suspicious osseous lesions. There are mild multilevel degenerative changes in the visualized spine. IMPRESSION: 1. No acute inflammatory process identified within the abdomen or pelvis. No CT imaging signs of acute pancreatitis. 2. Bilateral sub-4 mm nonobstructing renal calculi as described above. 3. Multiple other nonacute observations, as described above. Electronically Signed   By: Ree Molt M.D.   On: 08/19/2023 09:49    EKG: Independently reviewed.  Sinus rhythm, no acute ST changes.  Assessment/Plan Principal Problem:   Pancreatitis Active Problems:   Gastroparesis   Pancreatitis, acute  (please populate well all problems here in Problem List. (For example, if patient is on BP meds at home and you resume or decide to hold them, it is a problem that needs to be her. Same for CAD, COPD, HLD and so on)  Acute pancreatitis -Patient has acute onset of pain and elevated lipase, CT abdomen pelvis however does not show significant inflammation inside or around the pancreas.  Given there is also concurrent  elevation of LFTs including bilirubinemia and transaminitis, suspect passing gallstones/CBD sludge.  Will order MRCP -Given her age bracket, will order lipid panel and IgG4 study and outpatient PCP follow-up. -Symptomatic management, liquid diet Zofran  and Dilaudid  for pain  Acute on chronic gastroparesis flareup Intractable nausea vomiting -Patient declined Reglan  saying that he had severe side effect -Trial of Zofran  -Continue azithromycin -Outpatient referral to [facility] gastroenterology motility unit  Leukocytosis -Likely hemoconcentration as H&H also increased. -CT abdominal pelvis does not show a clear etiology of signs of chondritis or pancreas infection, will not continue antibiotics  IDDM -SSI  Diabetic neuropathy -Stable  DVT prophylaxis: SCD Code Status: Full code Family Communication: Wife at  bedside Disposition Plan: Expect less than 2 midnight hospital stay Consults called: None Admission status: MedSurg observation   Cort ONEIDA Mana MD [facility] Pager [phone]  08/19/2023, 1:13 PM

## 2023-08-19 NOTE — ED Notes (Signed)
Informed RN bed assigned 

## 2023-08-20 DIAGNOSIS — Z794 Long term (current) use of insulin: Secondary | ICD-10-CM

## 2023-08-20 DIAGNOSIS — K3184 Gastroparesis: Secondary | ICD-10-CM | POA: Diagnosis not present

## 2023-08-20 DIAGNOSIS — E1169 Type 2 diabetes mellitus with other specified complication: Secondary | ICD-10-CM | POA: Diagnosis not present

## 2023-08-20 DIAGNOSIS — D72829 Elevated white blood cell count, unspecified: Secondary | ICD-10-CM | POA: Insufficient documentation

## 2023-08-20 DIAGNOSIS — E785 Hyperlipidemia, unspecified: Secondary | ICD-10-CM | POA: Insufficient documentation

## 2023-08-20 LAB — HEPATIC FUNCTION PANEL
ALT: 64 U/L — ABNORMAL HIGH (ref 0–44)
AST: 37 U/L (ref 15–41)
Albumin: 3.5 g/dL (ref 3.5–5.0)
Alkaline Phosphatase: 76 U/L (ref 38–126)
Bilirubin, Direct: 0.2 mg/dL (ref 0.0–0.2)
Indirect Bilirubin: 1.5 mg/dL — ABNORMAL HIGH (ref 0.3–0.9)
Total Bilirubin: 1.7 mg/dL — ABNORMAL HIGH (ref 0.0–1.2)
Total Protein: 6.8 g/dL (ref 6.5–8.1)

## 2023-08-20 LAB — HEMOGLOBIN A1C
Hgb A1c MFr Bld: 5.7 % — ABNORMAL HIGH (ref 4.8–5.6)
Mean Plasma Glucose: 117 mg/dL

## 2023-08-20 LAB — HIV ANTIBODY (ROUTINE TESTING W REFLEX): HIV Screen 4th Generation wRfx: NONREACTIVE

## 2023-08-20 LAB — LIPASE, BLOOD: Lipase: 53 U/L — ABNORMAL HIGH (ref 11–51)

## 2023-08-20 LAB — GLUCOSE, CAPILLARY: Glucose-Capillary: 120 mg/dL — ABNORMAL HIGH (ref 70–99)

## 2023-08-20 MED ORDER — ONDANSETRON 4 MG PO TBDP
4.0000 mg | ORAL_TABLET | Freq: Three times a day (TID) | ORAL | 1 refills | Status: DC | PRN
Start: 2023-08-20 — End: 2023-10-22

## 2023-08-20 MED ORDER — METOCLOPRAMIDE HCL 10 MG PO TABS: 10.0000 mg | ORAL_TABLET | Freq: Three times a day (TID) | ORAL | 1 refills | Status: DC | PRN

## 2023-08-20 NOTE — Assessment & Plan Note (Signed)
-   Considered reactive, low suspicion for infection - No further workup

## 2023-08-20 NOTE — Plan of Care (Signed)

## 2023-08-20 NOTE — Assessment & Plan Note (Signed)
 -  Continue Lipitor

## 2023-08-20 NOTE — Hospital Course (Signed)
 Mr. [name] [name] is a 53 year old male with PMH DM II, gastroparesis, HLD who presented with lower abdominal pain after excessive retching due to nausea and vomiting.  He was admitted for further workup.  He was initially found to have mildly elevated lipase but no signs of pancreatitis on CT abdomen/pelvis.  Physical exam also not consistent with pancreatitis and therefore ruled out.  His clinical presentation of nausea/vomiting followed by abdominal pain from the excessive vomiting was considered due to gastroparesis flare. He was started on fluids and diet was slowly advanced which he tolerated well.  He was recommended to continue on gastroparesis diet at discharge and ongoing good glycemic control which he has been doing as evidenced by A1c of 5.7% on admission.

## 2023-08-20 NOTE — Discharge Summary (Signed)
 Physician Discharge Summary   [name] [name] FMW:[id number] DOB: 1970/12/29 DOA: 08/19/2023  PCP: System, Provider Not In  Admit date: 08/19/2023 Discharge date: 08/20/2023   Admitted From: Home Disposition: Home Discharging physician: Alm Apo, MD Barriers to discharge:   Recommendations at discharge: Continue chronic medical management  Discharge Condition: stable CODE STATUS: Full  Diet recommendation:  Diet Orders (From admission, onward)     Start     Ordered   08/20/23 0735  Diet Carb Modified Fluid consistency: Thin; Room service appropriate? Yes  Diet effective now       Question Answer Comment  Diet-HS Snack? Nothing   Calorie Level Medium 1600-2000   Fluid consistency: Thin   Room service appropriate? Yes      08/20/23 0734   08/20/23 0000  Diet Carb Modified        08/20/23 0840            Hospital Course: [name] [name] is a 53 year old male with PMH DM II, gastroparesis, HLD who presented with lower abdominal pain after excessive retching due to nausea and vomiting.  He was admitted for further workup.  He was initially found to have mildly elevated lipase but no signs of pancreatitis on CT abdomen/pelvis.  Physical exam also not consistent with pancreatitis and therefore ruled out.  His clinical presentation of nausea/vomiting followed by abdominal pain from the excessive vomiting was considered due to gastroparesis flare. He was started on fluids and diet was slowly advanced which he tolerated well.  He was recommended to continue on gastroparesis diet at discharge and ongoing good glycemic control which he has been doing as evidenced by A1c of 5.7% on admission.   Assessment and Plan: * Gastroparesis - Patient presented with symptoms consistent with gastroparesis flare - Abdominal pain followed after excessive nausea and vomiting.  Pancreatitis ruled out - Patient recommended to continue on gastroparesis diet at home - Short-term Reglan  given along with  Zofran  for nausea  Leukocytosis - Considered reactive, low suspicion for infection - No further workup  HLD (hyperlipidemia) - Continue Lipitor  DMII (diabetes mellitus, type 2) (HCC) - Has NovoLog  at home.  Also on Ozempic - A1c 5.7%   The patient's acute and chronic medical conditions were treated accordingly. On day of discharge, patient was felt deemed stable for discharge. Patient/family member advised to call PCP or come back to ER if needed.   Principal Diagnosis: Gastroparesis  Discharge Diagnoses: Active Hospital Problems   Diagnosis Date Noted   Gastroparesis 08/19/2023    Priority: 1.   HLD (hyperlipidemia) 08/20/2023   Leukocytosis 08/20/2023   DMII (diabetes mellitus, type 2) (HCC) 10/18/2007    Resolved Hospital Problems  No resolved problems to display.     Discharge Instructions     Diet Carb Modified   Complete by: As directed    Increase activity slowly   Complete by: As directed       Allergies as of 08/20/2023       Reactions   Metformin  Nausea And Vomiting, Nausea Only   Other reaction(s): Constipation (disorder), Vomiting (disorder), Nausea (finding), Vomiting        Medication List     TAKE these medications    atorvastatin  20 MG tablet Commonly known as: LIPITOR Take 20 mg by mouth daily.   cholecalciferol 1000 units tablet Commonly known as: VITAMIN D  Take 1,000 Units by mouth daily.   erythromycin 500 MG EC tablet Commonly known as: ERY-TAB Take 500 mg by mouth 3 (  three) times daily.   gabapentin  600 MG tablet Commonly known as: NEURONTIN  Take 1 tablet (600 mg total) by mouth 3 (three) times daily.   Insulin  Aspart FlexPen 100 UNIT/ML Commonly known as: NOVOLOG  Inject 0-60 Units into the skin daily.   metoCLOPramide  10 MG tablet Commonly known as: REGLAN  Take 1 tablet (10 mg total) by mouth every 8 (eight) hours as needed for nausea. What changed: how much to take   ondansetron  4 MG disintegrating tablet Commonly  known as: ZOFRAN -ODT Take 1 tablet (4 mg total) by mouth every 8 (eight) hours as needed for nausea or vomiting.   Ozempic (2 MG/DOSE) 8 MG/3ML Sopn Generic drug: Semaglutide (2 MG/DOSE) Inject 2 mg into the skin once a week.        Allergies  Allergen Reactions   Metformin  Nausea And Vomiting and Nausea Only    Other reaction(s): Constipation (disorder), Vomiting (disorder), Nausea (finding), Vomiting    Consultations:   Procedures:   Discharge Exam: BP (!) 151/81 (BP Location: Right Arm)   Pulse 72   Temp 98.4 F (36.9 C) (Oral)   Resp 18   Ht 5' 10 (1.778 m)   Wt 90.7 kg   SpO2 100%   BMI 28.70 kg/m  Physical Exam Constitutional:      General: He is not in acute distress.    Appearance: Normal appearance.  HENT:     Head: Normocephalic and atraumatic.     Mouth/Throat:     Mouth: Mucous membranes are moist.  Eyes:     Extraocular Movements: Extraocular movements intact.  Cardiovascular:     Rate and Rhythm: Normal rate and regular rhythm.  Pulmonary:     Effort: Pulmonary effort is normal. No respiratory distress.     Breath sounds: Normal breath sounds. No wheezing.  Abdominal:     General: Bowel sounds are normal. There is no distension.     Palpations: Abdomen is soft.     Tenderness: There is abdominal tenderness (Lower middle abdomen). There is no guarding.  Musculoskeletal:        General: Normal range of motion.     Cervical back: Normal range of motion and neck supple.  Skin:    General: Skin is warm and dry.  Neurological:     General: No focal deficit present.     Mental Status: He is alert.  Psychiatric:        Mood and Affect: Mood normal.        Behavior: Behavior normal.      The results of significant diagnostics from this hospitalization (including imaging, microbiology, ancillary and laboratory) are listed below for reference.   Microbiology: Recent Results (from the past 240 hours)  Culture, blood (routine x 2)     Status:  None (Preliminary result)   Collection Time: 08/19/23 12:03 PM   Specimen: BLOOD  Result Value Ref Range Status   Specimen Description BLOOD BLOOD RIGHT HAND  Final   Special Requests   Final    BOTTLES DRAWN AEROBIC AND ANAEROBIC Blood Culture adequate volume   Culture   Final    NO GROWTH < 24 HOURS Performed at [facility], [address] [place], [place] [address]    Report Status PENDING  Incomplete  Culture, blood (routine x 2)     Status: None (Preliminary result)   Collection Time: 08/19/23 12:03 PM   Specimen: BLOOD  Result Value Ref Range Status   Specimen Description BLOOD RIGHT ANTECUBITAL  Final  Special Requests   Final    BOTTLES DRAWN AEROBIC AND ANAEROBIC Blood Culture adequate volume   Culture   Final    NO GROWTH < 24 HOURS Performed at [facility], [address] [place], [place] [address]    Report Status PENDING  Incomplete     Labs: BNP (last 3 results) No results for input(s): BNP in the last 8760 hours. Basic Metabolic Panel: Recent Labs  Lab 08/19/23 0704  NA 139  K 3.4*  CL 97*  CO2 25  GLUCOSE 165*  BUN 19  CREATININE 1.19  CALCIUM  9.8   Liver Function Tests: Recent Labs  Lab 08/19/23 0704 08/20/23 0428  AST 68* 37  ALT 103* 64*  ALKPHOS 100 76  BILITOT 1.8* 1.7*  PROT 8.1 6.8  ALBUMIN 4.3 3.5   Recent Labs  Lab 08/19/23 0704 08/20/23 0428  LIPASE 158* 53*   No results for input(s): AMMONIA in the last 168 hours. CBC: Recent Labs  Lab 08/19/23 0704  WBC 13.9*  NEUTROABS 11.9*  HGB 17.8*  HCT 54.6*  MCV 88.8  PLT 353   Cardiac Enzymes: No results for input(s): CKTOTAL, CKMB, CKMBINDEX, TROPONINI in the last 168 hours. BNP: Invalid input(s): POCBNP CBG: Recent Labs  Lab 08/19/23 0700 08/19/23 1746 08/19/23 1812 08/19/23 2241 08/20/23 0804  GLUCAP 160* 178* 153* 130* 120*   D-Dimer No results for input(s): DDIMER in the last 72 hours. Hgb A1c Recent Labs     08/19/23 1310  HGBA1C 5.7*   Lipid Profile Recent Labs    08/19/23 0901  CHOL 198  HDL 50  LDLCALC 133*  TRIG 77  CHOLHDL 4.0   Thyroid  function studies No results for input(s): TSH, T4TOTAL, T3FREE, THYROIDAB in the last 72 hours.  Invalid input(s): FREET3 Anemia work up No results for input(s): VITAMINB12, FOLATE, FERRITIN, TIBC, IRON, RETICCTPCT in the last 72 hours. Urinalysis    Component Value Date/Time   COLORURINE YELLOW (A) 08/19/2023 1143   APPEARANCEUR CLEAR (A) 08/19/2023 1143   LABSPEC >1.046 (H) 08/19/2023 1143   PHURINE 6.0 08/19/2023 1143   GLUCOSEU 50 (A) 08/19/2023 1143   HGBUR MODERATE (A) 08/19/2023 1143   BILIRUBINUR NEGATIVE 08/19/2023 1143   BILIRUBINUR neg 07/05/2012 0945   KETONESUR 5 (A) 08/19/2023 1143   PROTEINUR 100 (A) 08/19/2023 1143   UROBILINOGEN 1.0 12/03/2012 0831   NITRITE NEGATIVE 08/19/2023 1143   LEUKOCYTESUR NEGATIVE 08/19/2023 1143   Sepsis Labs Recent Labs  Lab 08/19/23 0704  WBC 13.9*   Microbiology Recent Results (from the past 240 hours)  Culture, blood (routine x 2)     Status: None (Preliminary result)   Collection Time: 08/19/23 12:03 PM   Specimen: BLOOD  Result Value Ref Range Status   Specimen Description BLOOD BLOOD RIGHT HAND  Final   Special Requests   Final    BOTTLES DRAWN AEROBIC AND ANAEROBIC Blood Culture adequate volume   Culture   Final    NO GROWTH < 24 HOURS Performed at [facility], [address], [place], [place] [address]    Report Status PENDING  Incomplete  Culture, blood (routine x 2)     Status: None (Preliminary result)   Collection Time: 08/19/23 12:03 PM   Specimen: BLOOD  Result Value Ref Range Status   Specimen Description BLOOD RIGHT ANTECUBITAL  Final   Special Requests   Final    BOTTLES DRAWN AEROBIC AND ANAEROBIC Blood Culture adequate volume   Culture   Final  NO GROWTH < 24 HOURS Performed at [facility], [address]  [place] [place], [place] [address]    Report Status PENDING  Incomplete    Procedures/Studies: MR ABDOMEN MRCP WO CONTRAST Result Date: 08/19/2023 CLINICAL DATA:  Pancreatitis suspected;. Abdominal cramping. Nausea/vomiting. EXAM: MRI ABDOMEN WITHOUT CONTRAST  (INCLUDING MRCP) TECHNIQUE: Multiplanar multisequence MR imaging of the abdomen was performed. Heavily T2-weighted images of the biliary and pancreatic ducts were obtained, and three-dimensional MRCP images were rendered by post processing. COMPARISON:  CT scan abdomen and pelvis from earlier the same day. FINDINGS: Lower chest: Unremarkable MR appearance to the lung bases. No pleural effusion. No pericardial effusion. Normal heart size. Hepatobiliary: The liver is normal in size and configuration. No intrahepatic or extrahepatic bile duct dilatation. No choledocholithiasis. The gallbladder is distended with large amount of sub 5 mm gallstones. However, no abnormal wall thickening or pericholecystic fat stranding. No imaging evidence of acute cholecystitis. Pancreas: No mass, inflammatory changes or other parenchymal abnormality identified. No main pancreatic duct dilation. Spleen:  Within normal limits in size and appearance. No focal mass. Adrenals/Urinary Tract: Unremarkable adrenal glands. No hydroureteronephrosis. No suspicious renal mass. There are simple cysts, 1 each in bilateral kidneys. Stomach/Bowel: Visualized portions within the abdomen are unremarkable. No disproportionate dilation of bowel loops. Vascular/Lymphatic: No pathologically enlarged lymph nodes identified. No abdominal aortic aneurysm demonstrated. No ascites. Other:  None. Musculoskeletal: No suspicious bone lesions identified. IMPRESSION: 1. No MR signs of acute pancreatitis. 2. Cholelithiasis without acute cholecystitis. No intra or extrahepatic bile duct dilation. No choledocholithiasis. 3. Multiple other nonacute observations, as described above. Electronically Signed   By: Ree Molt M.D.   On: 08/19/2023 16:11   MR 3D Recon At Scanner Result Date: 08/19/2023 CLINICAL DATA:  Pancreatitis suspected;. Abdominal cramping. Nausea/vomiting. EXAM: MRI ABDOMEN WITHOUT CONTRAST  (INCLUDING MRCP) TECHNIQUE: Multiplanar multisequence MR imaging of the abdomen was performed. Heavily T2-weighted images of the biliary and pancreatic ducts were obtained, and three-dimensional MRCP images were rendered by post processing. COMPARISON:  CT scan abdomen and pelvis from earlier the same day. FINDINGS: Lower chest: Unremarkable MR appearance to the lung bases. No pleural effusion. No pericardial effusion. Normal heart size. Hepatobiliary: The liver is normal in size and configuration. No intrahepatic or extrahepatic bile duct dilatation. No choledocholithiasis. The gallbladder is distended with large amount of sub 5 mm gallstones. However, no abnormal wall thickening or pericholecystic fat stranding. No imaging evidence of acute cholecystitis. Pancreas: No mass, inflammatory changes or other parenchymal abnormality identified. No main pancreatic duct dilation. Spleen:  Within normal limits in size and appearance. No focal mass. Adrenals/Urinary Tract: Unremarkable adrenal glands. No hydroureteronephrosis. No suspicious renal mass. There are simple cysts, 1 each in bilateral kidneys. Stomach/Bowel: Visualized portions within the abdomen are unremarkable. No disproportionate dilation of bowel loops. Vascular/Lymphatic: No pathologically enlarged lymph nodes identified. No abdominal aortic aneurysm demonstrated. No ascites. Other:  None. Musculoskeletal: No suspicious bone lesions identified. IMPRESSION: 1. No MR signs of acute pancreatitis. 2. Cholelithiasis without acute cholecystitis. No intra or extrahepatic bile duct dilation. No choledocholithiasis. 3. Multiple other nonacute observations, as described above. Electronically Signed   By: Ree Molt M.D.   On: 08/19/2023 16:11   US  ABDOMEN LIMITED  RUQ (LIVER/GB) Result Date: 08/19/2023 CLINICAL DATA:  Pancreatitis EXAM: ULTRASOUND ABDOMEN LIMITED RIGHT UPPER QUADRANT COMPARISON:  CT earlier 08/19/2023 FINDINGS: Gallbladder: Distended gallbladder with dependent stones. No wall thickening or adjacent fluid. No reported sonographic Murphy's sign. Common bile duct: Diameter: 4 mm  Liver: Diffusely echogenic hepatic parenchyma consistent with fatty liver infiltration. Portal vein is patent on color Doppler imaging with normal direction of blood flow towards the liver. Other: Please correlate with separate CT scan. IMPRESSION: Fatty liver infiltration. Gallstones. No further sonographic evidence of acute cholecystitis. No biliary ductal dilatation. Electronically Signed   By: Ranell Bring M.D.   On: 08/19/2023 11:26   CT ABDOMEN PELVIS W CONTRAST Result Date: 08/19/2023 CLINICAL DATA:  Pancreatitis, acute, severe. Shortness of breath. Abdominal cramping. Nausea/vomiting and weakness. EXAM: CT ABDOMEN AND PELVIS WITH CONTRAST TECHNIQUE: Multidetector CT imaging of the abdomen and pelvis was performed using the standard protocol following bolus administration of intravenous contrast. RADIATION DOSE REDUCTION: This exam was performed according to the departmental dose-optimization program which includes automated exposure control, adjustment of the mA and/or kV according to patient size and/or use of iterative reconstruction technique. CONTRAST:  OMNIPAQUE  IOHEXOL  300 MG/ML  SOLN COMPARISON:  CT scan abdomen and pelvis. FINDINGS: Lower chest: The lung bases are clear. No pleural effusion. The heart is normal in size. No pericardial effusion. Hepatobiliary: The liver is normal in size. Non-cirrhotic configuration. No suspicious mass. These is mild diffuse hepatic steatosis. No intrahepatic or extrahepatic bile duct dilation. No calcified gallstones. Normal gallbladder wall thickness. No pericholecystic inflammatory changes. Pancreas: Unremarkable. No pancreatic  ductal dilatation or surrounding inflammatory changes. Spleen: Within normal limits. No focal lesion. Adrenals/Urinary Tract: Adrenal glands are unremarkable. No suspicious renal mass. There are simple renal cysts, 1 each in bilateral kidneys with largest measuring up to 2.0 x 2.1 cm in the left kidney. There are at least 5, sub 4 mm nonobstructing calculi in the left kidney and at least 2, sub 4 mm nonobstructing calculi in the right kidney. No ureterolithiasis. No hydroureteronephrosis. Unremarkable urinary bladder. Stomach/Bowel: No disproportionate dilation of the small or large bowel loops. No evidence of abnormal bowel wall thickening or inflammatory changes. The appendix is unremarkable. Vascular/Lymphatic: No ascites or pneumoperitoneum. No abdominal or pelvic lymphadenopathy, by size criteria. No aneurysmal dilation of the major abdominal arteries. There are mild peripheral atherosclerotic vascular calcifications of the aorta and its major branches. Reproductive: Normal size prostate. Symmetric seminal vesicles. Penile implant noted with its reservoir in the right lower anterior pelvis region. Other: There is a tiny fat containing umbilical hernia. There also small bilateral fat containing inguinal hernias. The soft tissues and abdominal wall are otherwise unremarkable. Musculoskeletal: No suspicious osseous lesions. There are mild multilevel degenerative changes in the visualized spine. IMPRESSION: 1. No acute inflammatory process identified within the abdomen or pelvis. No CT imaging signs of acute pancreatitis. 2. Bilateral sub-4 mm nonobstructing renal calculi as described above. 3. Multiple other nonacute observations, as described above. Electronically Signed   By: Ree Molt M.D.   On: 08/19/2023 09:49     Time coordinating discharge: Over 30 minutes    Alm Apo, MD  [facility] 08/20/2023, 12:08 PM

## 2023-08-20 NOTE — Assessment & Plan Note (Signed)
-   Patient presented with symptoms consistent with gastroparesis flare - Abdominal pain followed after excessive nausea and vomiting.  Pancreatitis ruled out - Patient recommended to continue on gastroparesis diet at home - Short-term Reglan  given along with Zofran  for nausea

## 2023-08-20 NOTE — Assessment & Plan Note (Signed)
-   Has NovoLog at home.  Also on Ozempic - A1c 5.7%

## 2023-08-22 LAB — IGG 1, 2, 3, AND 4
IgG (Immunoglobin G), Serum: 1235 mg/dL (ref 603–1613)
IgG, Subclass 1: 659 mg/dL (ref 248–810)
IgG, Subclass 2: 446 mg/dL (ref 130–555)
IgG, Subclass 3: 41 mg/dL (ref 15–102)
IgG, Subclass 4: 25 mg/dL (ref 2–96)

## 2023-08-24 LAB — CULTURE, BLOOD (ROUTINE X 2)
Culture: NO GROWTH
Culture: NO GROWTH
Special Requests: ADEQUATE
Special Requests: ADEQUATE

## 2023-09-07 DIAGNOSIS — K3184 Gastroparesis: Secondary | ICD-10-CM | POA: Diagnosis not present

## 2023-09-07 DIAGNOSIS — E119 Type 2 diabetes mellitus without complications: Secondary | ICD-10-CM | POA: Diagnosis not present

## 2023-10-19 ENCOUNTER — Emergency Department (HOSPITAL_COMMUNITY)
Admission: EM | Admit: 2023-10-19 | Discharge: 2023-10-19 | Disposition: A | Discharge status: Home / Self Care | Attending: Emergency Medicine | Admitting: Emergency Medicine

## 2023-10-19 ENCOUNTER — Emergency Department (HOSPITAL_COMMUNITY)

## 2023-10-19 ENCOUNTER — Other Ambulatory Visit: Payer: Self-pay

## 2023-10-19 DIAGNOSIS — E119 Type 2 diabetes mellitus without complications: Secondary | ICD-10-CM | POA: Diagnosis not present

## 2023-10-19 DIAGNOSIS — R112 Nausea with vomiting, unspecified: Secondary | ICD-10-CM | POA: Diagnosis not present

## 2023-10-19 DIAGNOSIS — R5383 Other fatigue: Secondary | ICD-10-CM | POA: Diagnosis not present

## 2023-10-19 DIAGNOSIS — N281 Cyst of kidney, acquired: Secondary | ICD-10-CM | POA: Diagnosis not present

## 2023-10-19 DIAGNOSIS — Z794 Long term (current) use of insulin: Secondary | ICD-10-CM | POA: Diagnosis not present

## 2023-10-19 DIAGNOSIS — R1084 Generalized abdominal pain: Secondary | ICD-10-CM | POA: Diagnosis not present

## 2023-10-19 DIAGNOSIS — I10 Essential (primary) hypertension: Secondary | ICD-10-CM | POA: Insufficient documentation

## 2023-10-19 DIAGNOSIS — R109 Unspecified abdominal pain: Secondary | ICD-10-CM

## 2023-10-19 DIAGNOSIS — N200 Calculus of kidney: Secondary | ICD-10-CM | POA: Diagnosis not present

## 2023-10-19 LAB — COMPREHENSIVE METABOLIC PANEL
ALT: 32 U/L (ref 0–44)
AST: 42 U/L — ABNORMAL HIGH (ref 15–41)
Albumin: 4.5 g/dL (ref 3.5–5.0)
Alkaline Phosphatase: 103 U/L (ref 38–126)
Anion gap: 16 — ABNORMAL HIGH (ref 5–15)
BUN: 21 mg/dL — ABNORMAL HIGH (ref 6–20)
CO2: 22 mmol/L (ref 22–32)
Calcium: 9.6 mg/dL (ref 8.9–10.3)
Chloride: 96 mmol/L — ABNORMAL LOW (ref 98–111)
Creatinine, Ser: 1.2 mg/dL (ref 0.61–1.24)
GFR, Estimated: >60 mL/min (ref >60)
Glucose, Bld: 214 mg/dL — ABNORMAL HIGH (ref 70–99)
Potassium: 3.7 mmol/L (ref 3.5–5.1)
Sodium: 134 mmol/L — ABNORMAL LOW (ref 135–145)
Total Bilirubin: 2.6 mg/dL — ABNORMAL HIGH (ref 0.0–1.2)
Total Protein: 8.4 g/dL — ABNORMAL HIGH (ref 6.5–8.1)

## 2023-10-19 LAB — CBC
HCT: 53.6 % — ABNORMAL HIGH (ref 39.0–52.0)
Hemoglobin: 17.6 g/dL — ABNORMAL HIGH (ref 13.0–17.0)
MCH: 29.1 pg (ref 26.0–34.0)
MCHC: 32.8 g/dL (ref 30.0–36.0)
MCV: 88.7 fL (ref 80.0–100.0)
Platelets: 345 10*3/uL (ref 150–400)
RBC: 6.04 MIL/uL — ABNORMAL HIGH (ref 4.22–5.81)
RDW: 13.5 % (ref 11.5–15.5)
WBC: 11.9 10*3/uL — ABNORMAL HIGH (ref 4.0–10.5)
nRBC: 0 % (ref 0.0–0.2)

## 2023-10-19 LAB — CBG MONITORING, ED: Glucose-Capillary: 189 mg/dL — ABNORMAL HIGH (ref 70–99)

## 2023-10-19 LAB — URINALYSIS, ROUTINE W REFLEX MICROSCOPIC
Bacteria, UA: NONE SEEN
Bilirubin Urine: NEGATIVE
Glucose, UA: 150 mg/dL — AB
Ketones, ur: 20 mg/dL — AB
Leukocytes,Ua: NEGATIVE
Nitrite: NEGATIVE
Protein, ur: >=300 mg/dL — AB
Specific Gravity, Urine: 1.026 (ref 1.005–1.030)
pH: 5 (ref 5.0–8.0)

## 2023-10-19 LAB — LIPASE, BLOOD: Lipase: 27 U/L (ref 11–51)

## 2023-10-19 LAB — TROPONIN I (HIGH SENSITIVITY): Troponin I (High Sensitivity): 17 ng/L (ref <18)

## 2023-10-19 MED ORDER — ONDANSETRON 4 MG PO TBDP: 4.0000 mg | ORAL_TABLET | Freq: Three times a day (TID) | ORAL | 0 refills | Status: DC | PRN

## 2023-10-19 MED ORDER — ALUM & MAG HYDROXIDE-SIMETH 200-200-20 MG/5ML PO SUSP
30.0000 mL | Freq: Once | ORAL | Status: AC
  Administered 2023-10-19: 30 mL via ORAL
  Filled 2023-10-19: qty 30

## 2023-10-19 MED ORDER — SODIUM CHLORIDE 0.9 % IV BOLUS
1000.0000 mL | Freq: Once | INTRAVENOUS | Status: AC
  Administered 2023-10-19: 1000 mL via INTRAVENOUS

## 2023-10-19 MED ORDER — ONDANSETRON HCL 4 MG/2ML IJ SOLN
4.0000 mg | Freq: Once | INTRAMUSCULAR | Status: AC | PRN
  Administered 2023-10-19: 4 mg via INTRAVENOUS
  Filled 2023-10-19: qty 2

## 2023-10-19 MED ORDER — CLONIDINE HCL 0.1 MG PO TABS
0.2000 mg | ORAL_TABLET | Freq: Once | ORAL | Status: AC
  Administered 2023-10-19: 0.2 mg via ORAL
  Filled 2023-10-19: qty 2

## 2023-10-19 MED ORDER — DROPERIDOL 2.5 MG/ML IJ SOLN
1.2500 mg | Freq: Once | INTRAMUSCULAR | Status: AC
  Administered 2023-10-19: 1.25 mg via INTRAVENOUS
  Filled 2023-10-19: qty 2

## 2023-10-19 MED ORDER — IOHEXOL 300 MG/ML  SOLN
100.0000 mL | Freq: Once | INTRAMUSCULAR | Status: AC | PRN
  Administered 2023-10-19: 100 mL via INTRAVENOUS

## 2023-10-19 MED ORDER — ONDANSETRON HCL 4 MG/2ML IJ SOLN
4.0000 mg | Freq: Once | INTRAMUSCULAR | Status: AC
  Administered 2023-10-19: 4 mg via INTRAVENOUS
  Filled 2023-10-19: qty 2

## 2023-10-19 MED ORDER — LIDOCAINE VISCOUS HCL 2 % MT SOLN
15.0000 mL | Freq: Once | OROMUCOSAL | Status: AC
  Administered 2023-10-19: 15 mL via ORAL
  Filled 2023-10-19: qty 15

## 2023-10-19 MED ORDER — METOCLOPRAMIDE HCL 10 MG PO TABS: 10.0000 mg | ORAL_TABLET | Freq: Three times a day (TID) | ORAL | 0 refills | Status: DC | PRN

## 2023-10-19 NOTE — ED Notes (Signed)
 Pt actively vomiting, brown emesis

## 2023-10-19 NOTE — Discharge Instructions (Addendum)
 Please make sure to follow up with provider who manages your diabetes. Symptoms are likely due to Ozempic. You sugar was 189. You blood pressure here in ED was elevated and may be related to pain. Please make sure to bring this up to PCP when you set appointment. I have sent reglan and zofran to use as needed for NV  Return to ED if worsening symptoms, intractable vomiting

## 2023-10-19 NOTE — ED Provider Notes (Signed)
 Received patient by previous provider pending CT and completion of ED workup.  See his note.  In short, patient presents emergency department for evaluation of nausea, vomiting, abdominal cramping that started yesterday.  He was admitted last month for similar symptoms for 1 day and it was suspected that it was related to Ozempic.  ED workup is notable for CBG 189.  Troponin x 1 negative.  UA has 150 glucose, moderate hemoglobin, 20 ketones, greater than 300 protein but otherwise does not appear infectious.  Hemoglobin is likely secondary to stable kidney stones.  Lipase WNL.  WBC 11.9.  Nausea, vomiting, abdominal pain significantly improved following droperidol, IVF, Maalox.  I discussed with patient to follow-up with primary care provider regarding high blood pressure.  1 year ago at his last PCP visit, he was not hypertensive however he was hypertensive at last ED visit.  It could be related to pain, nausea, vomiting versus primary HTN.  Also discussed that patient should follow-up with PCP regarding different CBG control as he seems to be sensitive to Ozempic.  He expresses understanding with these recommendations.  I discussed ED workup, disposition, return to emergency department cautions with patient expresses understanding with the plan.  All questions answered to his satisfaction.  He is agreeable discharge.  Discharge instructions provided on discharge paperwork   Judithann Sheen, PA 10/19/23 2013    Gwyneth Sprout, MD 10/19/23 2235

## 2023-10-19 NOTE — ED Notes (Signed)
 Reviewed D/C information with the patient, pt verbalized understanding. No additional concerns at this time.

## 2023-10-19 NOTE — ED Notes (Signed)
 Pt stated he has burning pain in abdomen, generalized  EDP notified

## 2023-10-19 NOTE — ED Triage Notes (Signed)
 Pt reports emesis for 2 days. States he was off ozempic after last hospital stay but sugar was all over the place. Took 2mg  ozempic 2 days ago and has been vomiting since.

## 2023-10-19 NOTE — ED Notes (Signed)
Nurse notified of bp.

## 2023-10-19 NOTE — ED Provider Triage Note (Signed)
 Emergency Medicine Provider Triage Evaluation Note  [name] [name] , a 53 y.o. male  was evaluated in triage.  Pt complains of vomiting.  Review of Systems  Positive:  Negative:  Physical Exam  BP (!) 217/106 (BP Location: Right Arm)   Pulse (!) 102   Temp 98.9 F (37.2 C) (Oral)   Resp 18   Ht 5\' 10"  (1.778 m)   Wt 90.7 kg   SpO2 100%   BMI 28.70 kg/m  Gen:   Awake, no distress   Resp:  Normal effort  MSK:   Moves extremities without difficulty  Other:    Medical Decision Making  Medically screening exam initiated at 12:45 PM.  Appropriate orders placed.  [name] [name] was informed that the remainder of the evaluation will be completed by another provider, this initial triage assessment does not replace that evaluation, and the importance of remaining in the ED until their evaluation is complete.  Abdominal pain and vomiting since starting ozempic 2 days ago.   Dorthy Cooler, [place] 10/19/23 1246

## 2023-10-19 NOTE — ED Notes (Addendum)
 Pt states "I have a burning sensation all over my stomach after receiving the dye.   Pt requesting ice chips  BP is elevated to over 180 sys consistently    EDP notified

## 2023-10-19 NOTE — ED Provider Notes (Signed)
 [place] EMERGENCY DEPARTMENT AT [facility] Provider Note   CSN: [id number] Arrival date & time: 10/19/23  0421     History  Chief Complaint  Patient presents with   Emesis    [name] [name] is a 53 y.o. male.  The history is provided by the patient, medical records and the EMS personnel. No language interpreter was used.  Emesis    53 year old male history of diabetes recently started on Ozempic 2 days ago presenting with complaint of abdominal cramping with associate nausea and vomiting.  Patient mentioned he was hospitalized last month for abdominal cramping with associate nausea and vomiting.  He was told that it could be due to Ozempic.  He stopped this medication and was doing fine however he noticed that his blood sugar has been fluctuating.  Several days ago he decided to resume taking Ozempic and shortly after he developed the same symptoms.  He felt this is likely a side effect from the medication.  He endorsed extreme nausea and fatigue.  He cannot keep anything down.  He reported having normal bowel movement no diarrhea constipation.  No fever no runny nose sneezing or coughing  Home Medications Prior to Admission medications   Medication Sig Start Date End Date Taking? Authorizing Provider  atorvastatin (LIPITOR) 20 MG tablet Take 20 mg by mouth daily. Patient not taking: Reported on 08/19/2023    [provider]  cholecalciferol (VITAMIN D) 1000 UNITS tablet Take 1,000 Units by mouth daily.    [provider]  erythromycin (ERY-TAB) 500 MG EC tablet Take 500 mg by mouth 3 (three) times daily. 08/11/23   [provider]  gabapentin (NEURONTIN) 600 MG tablet Take 1 tablet (600 mg total) by mouth 3 (three) times daily. Patient not taking: Reported on 08/19/2023 01/15/15   Huston Foley, MD  Insulin Aspart FlexPen (NOVOLOG) 100 UNIT/ML Inject 0-60 Units into the skin daily. 07/10/19   [provider]  metoCLOPramide (REGLAN) 10 MG  tablet Take 1 tablet (10 mg total) by mouth every 8 (eight) hours as needed for nausea. 08/20/23   Lewie Chamber, MD  ondansetron (ZOFRAN-ODT) 4 MG disintegrating tablet Take 1 tablet (4 mg total) by mouth every 8 (eight) hours as needed for nausea or vomiting. 08/20/23   Lewie Chamber, MD  OZEMPIC, 2 MG/DOSE, 8 MG/3ML SOPN Inject 2 mg into the skin once a week. 06/22/23 06/21/24  [provider]      Allergies    Metformin    Review of Systems   Review of Systems  Gastrointestinal:  Positive for vomiting.  All other systems reviewed and are negative.   Physical Exam Updated Vital Signs BP (!) 217/106 (BP Location: Right Arm)   Pulse (!) 102   Temp 98.9 F (37.2 C) (Oral)   Resp 18   Ht 5\' 10"  (1.778 m)   Wt 90.7 kg   SpO2 100%   BMI 28.70 kg/m  Physical Exam Vitals and nursing note reviewed.  Constitutional:      General: He is not in acute distress.    Appearance: He is well-developed.     Comments: Patient appears uncomfortable  HENT:     Head: Atraumatic.  Eyes:     Conjunctiva/sclera: Conjunctivae normal.  Cardiovascular:     Rate and Rhythm: Tachycardia present.  Pulmonary:     Effort: Pulmonary effort is normal.     Breath sounds: Normal breath sounds.  Abdominal:     Palpations: Abdomen is soft.  Tenderness: There is abdominal tenderness (Mild diffuse tenderness without focal point tenderness).  Musculoskeletal:     Cervical back: Neck supple.  Skin:    Findings: No rash.  Neurological:     Mental Status: He is alert.     ED Results / Procedures / Treatments   Labs (all labs ordered are listed, but only abnormal results are displayed) Labs Reviewed  COMPREHENSIVE METABOLIC PANEL - Abnormal; Notable for the following components:      Result Value   Sodium 134 (*)    Chloride 96 (*)    Glucose, Bld 214 (*)    BUN 21 (*)    Total Protein 8.4 (*)    AST 42 (*)    Total Bilirubin 2.6 (*)    Anion gap 16 (*)    All other components within  normal limits  CBC - Abnormal; Notable for the following components:   WBC 11.9 (*)    RBC 6.04 (*)    Hemoglobin 17.6 (*)    HCT 53.6 (*)    All other components within normal limits  URINALYSIS, ROUTINE W REFLEX MICROSCOPIC - Abnormal; Notable for the following components:   Glucose, UA 150 (*)    Hgb urine dipstick MODERATE (*)    Ketones, ur 20 (*)    Protein, ur >=300 (*)    All other components within normal limits  CBG MONITORING, ED - Abnormal; Notable for the following components:   Glucose-Capillary 189 (*)    All other components within normal limits  LIPASE, BLOOD  TROPONIN I (HIGH SENSITIVITY)  TROPONIN I (HIGH SENSITIVITY)    EKG EKG Interpretation Date/Time:  Wednesday October 19 2023 11:05:33 EST Ventricular Rate:  98 PR Interval:  102 QRS Duration:  80 QT Interval:  358 QTC Calculation: 457 R Axis:   229  Text Interpretation: Sinus rhythm with short PR Right superior axis deviation When compared with ECG of 19-Aug-2023 06:50, PREVIOUS ECG IS PRESENT Confirmed by Alvester Chou [phone]) on 10/19/2023 11:26:31 AM  Radiology No results found.  Procedures Procedures    Medications Ordered in ED Medications  ondansetron (ZOFRAN) injection 4 mg (4 mg Intravenous Given 10/19/23 0453)  sodium chloride 0.9 % bolus 1,000 mL (1,000 mLs Intravenous New Bag/Given 10/19/23 0510)  cloNIDine (CATAPRES) tablet 0.2 mg (0.2 mg Oral Given 10/19/23 1100)  ondansetron (ZOFRAN) injection 4 mg (4 mg Intravenous Given 10/19/23 1253)    ED Course/ Medical Decision Making/ A&P                                 Medical Decision Making Amount and/or Complexity of Data Reviewed Labs: ordered.  Risk Prescription drug management.   BP (!) 194/104 (BP Location: Right Arm)   Pulse 95   Temp 98.9 F (37.2 C) (Oral)   Resp 18   Ht 5\' 10"  (1.778 m)   Wt 90.7 kg   SpO2 94%   BMI 28.70 kg/m   31:20 PM 53 year old male history of diabetes recently started on Ozempic 2 days ago  presenting with complaint of abdominal cramping with associate nausea and vomiting.  Patient mentioned he was hospitalized last month for abdominal cramping with associate nausea and vomiting.  He was told that it could be due to Ozempic.  He stopped this medication and was doing fine however he noticed that his blood sugar has been fluctuating.  Several days ago he decided to resume taking Ozempic and  shortly after he developed the same symptoms.  He felt this is likely a side effect from the medication.  He endorsed extreme nausea and fatigue.  He cannot keep anything down.  He reported having normal bowel movement no diarrhea constipation.  No fever no runny nose sneezing or coughing  Patient appears uncomfortable.  He has some mild generalized abdominal tenderness.  He is mildly tachycardic.  Vitals are notable for mildly elevated blood pressure of  217/106.  Patient is afebrile no hypoxia.  -Labs ordered, independently viewed and interpreted by me.  Labs remarkable for CBG 214 with an anion gap of 16 and 20 ketone suggestive of diabetic ketosis.   -The patient was maintained on a cardiac monitor.  I personally viewed and interpreted the cardiac monitored which showed an underlying rhythm of: NSR -Imaging including abd/pelvis CT ordered, have not resulted yet -This patient presents to the ED for concern of n/v, this involves an extensive number of treatment options, and is a complaint that carries with it a high risk of complications and morbidity.  The differential diagnosis includes side effect from Ozempic, gastroparesis, colitis, SBO -Co morbidities that complicate the patient evaluation includes DM -Treatment includes zofran, clonidine, IVF, droperidol -Reevaluation of the patient after these medicines showed that the patient improved -PCP office notes or outside notes reviewed -Discussion with oncoming provider who will f/u on Ct result, focus of sxs control and determine  disposition -Escalation to admission/observation considered: dispo pending  Pt was found to be hypertensive.  Antihypertensive medication given.  No documented hx of HTN however from prior ER visits his BP have been in the 180s systolic.  Pt likely benefit from antihypertensive medication at discharge.        Final Clinical Impression(s) / ED Diagnoses Final diagnoses:  Uncontrolled hypertension    Rx / DC Orders ED Discharge Orders     None         Fayrene Helper, PA-C 10/19/23 1532    Terald Sleeper, MD 10/19/23 1539

## 2023-10-19 NOTE — ED Notes (Signed)
 Pt c/o chest pain, MD notified, EKG ordered.

## 2023-10-21 ENCOUNTER — Other Ambulatory Visit: Payer: Self-pay

## 2023-10-21 ENCOUNTER — Encounter (HOSPITAL_COMMUNITY): Payer: Self-pay

## 2023-10-21 ENCOUNTER — Observation Stay (HOSPITAL_COMMUNITY)
Admission: EM | Admit: 2023-10-21 | Discharge: 2023-10-22 | Disposition: A | Discharge status: Home / Self Care | Attending: Emergency Medicine | Admitting: Emergency Medicine

## 2023-10-21 DIAGNOSIS — R1084 Generalized abdominal pain: Secondary | ICD-10-CM | POA: Insufficient documentation

## 2023-10-21 DIAGNOSIS — E119 Type 2 diabetes mellitus without complications: Secondary | ICD-10-CM | POA: Diagnosis not present

## 2023-10-21 DIAGNOSIS — E860 Dehydration: Secondary | ICD-10-CM | POA: Insufficient documentation

## 2023-10-21 DIAGNOSIS — R1110 Vomiting, unspecified: Principal | ICD-10-CM | POA: Diagnosis present

## 2023-10-21 DIAGNOSIS — Z79899 Other long term (current) drug therapy: Secondary | ICD-10-CM | POA: Insufficient documentation

## 2023-10-21 DIAGNOSIS — D751 Secondary polycythemia: Secondary | ICD-10-CM | POA: Insufficient documentation

## 2023-10-21 DIAGNOSIS — R112 Nausea with vomiting, unspecified: Secondary | ICD-10-CM | POA: Diagnosis not present

## 2023-10-21 DIAGNOSIS — Z794 Long term (current) use of insulin: Secondary | ICD-10-CM | POA: Diagnosis not present

## 2023-10-21 LAB — LIPASE, BLOOD: Lipase: 55 U/L — ABNORMAL HIGH (ref 11–51)

## 2023-10-21 LAB — MAGNESIUM: Magnesium: 2.4 mg/dL (ref 1.7–2.4)

## 2023-10-21 LAB — CBC
HCT: 54.6 % — ABNORMAL HIGH (ref 39.0–52.0)
HCT: 48.3 % (ref 39.0–52.0)
Hemoglobin: 17.5 g/dL — ABNORMAL HIGH (ref 13.0–17.0)
Hemoglobin: 15.5 g/dL (ref 13.0–17.0)
MCH: 28.7 pg (ref 26.0–34.0)
MCH: 28.5 pg (ref 26.0–34.0)
MCHC: 32.1 g/dL (ref 30.0–36.0)
MCHC: 32.1 g/dL (ref 30.0–36.0)
MCV: 89.7 fL (ref 80.0–100.0)
MCV: 89 fL (ref 80.0–100.0)
Platelets: 311 10*3/uL (ref 150–400)
Platelets: 308 10*3/uL (ref 150–400)
RBC: 6.09 MIL/uL — ABNORMAL HIGH (ref 4.22–5.81)
RBC: 5.43 MIL/uL (ref 4.22–5.81)
RDW: 13.2 % (ref 11.5–15.5)
RDW: 13.2 % (ref 11.5–15.5)
WBC: 14.2 10*3/uL — ABNORMAL HIGH (ref 4.0–10.5)
WBC: 12.1 10*3/uL — ABNORMAL HIGH (ref 4.0–10.5)
nRBC: 0 % (ref 0.0–0.2)
nRBC: 0 % (ref 0.0–0.2)

## 2023-10-21 LAB — COMPREHENSIVE METABOLIC PANEL
ALT: 23 U/L (ref 0–44)
AST: 28 U/L (ref 15–41)
Albumin: 4 g/dL (ref 3.5–5.0)
Alkaline Phosphatase: 86 U/L (ref 38–126)
Anion gap: 12 (ref 5–15)
BUN: 32 mg/dL — ABNORMAL HIGH (ref 6–20)
CO2: 25 mmol/L (ref 22–32)
Calcium: 8.6 mg/dL — ABNORMAL LOW (ref 8.9–10.3)
Chloride: 97 mmol/L — ABNORMAL LOW (ref 98–111)
Creatinine, Ser: 1.21 mg/dL (ref 0.61–1.24)
GFR, Estimated: >60 mL/min (ref >60)
Glucose, Bld: 204 mg/dL — ABNORMAL HIGH (ref 70–99)
Potassium: 3.4 mmol/L — ABNORMAL LOW (ref 3.5–5.1)
Sodium: 134 mmol/L — ABNORMAL LOW (ref 135–145)
Total Bilirubin: 2.9 mg/dL — ABNORMAL HIGH (ref 0.0–1.2)
Total Protein: 7.6 g/dL (ref 6.5–8.1)

## 2023-10-21 LAB — URINALYSIS, ROUTINE W REFLEX MICROSCOPIC
Bacteria, UA: NONE SEEN
Bilirubin Urine: NEGATIVE
Glucose, UA: NEGATIVE mg/dL
Ketones, ur: 5 mg/dL — AB
Leukocytes,Ua: NEGATIVE
Nitrite: NEGATIVE
Protein, ur: 100 mg/dL — AB
Specific Gravity, Urine: 1.026 (ref 1.005–1.030)
pH: 5 (ref 5.0–8.0)

## 2023-10-21 LAB — CBG MONITORING, ED
Glucose-Capillary: 190 mg/dL — ABNORMAL HIGH (ref 70–99)
Glucose-Capillary: 133 mg/dL — ABNORMAL HIGH (ref 70–99)

## 2023-10-21 LAB — TROPONIN I (HIGH SENSITIVITY): Troponin I (High Sensitivity): 11 ng/L (ref <18)

## 2023-10-21 LAB — RAPID URINE DRUG SCREEN, HOSP PERFORMED
Amphetamines: NOT DETECTED
Barbiturates: NOT DETECTED
Benzodiazepines: NOT DETECTED
Cocaine: NOT DETECTED
Opiates: NOT DETECTED
Tetrahydrocannabinol: POSITIVE — AB

## 2023-10-21 LAB — CREATININE, SERUM
Creatinine, Ser: 1.04 mg/dL (ref 0.61–1.24)
GFR, Estimated: >60 mL/min (ref >60)

## 2023-10-21 LAB — GLUCOSE, CAPILLARY: Glucose-Capillary: 148 mg/dL — ABNORMAL HIGH (ref 70–99)

## 2023-10-21 MED ORDER — ACETAMINOPHEN 325 MG PO TABS: 650.0000 mg | ORAL_TABLET | Freq: Four times a day (QID) | ORAL | Status: DC | PRN

## 2023-10-21 MED ORDER — POLYETHYLENE GLYCOL 3350 17 G PO PACK: 17.0000 g | PACK | Freq: Every day | ORAL | Status: DC | PRN

## 2023-10-21 MED ORDER — SODIUM CHLORIDE 0.9 % IV SOLN
12.5000 mg | Freq: Four times a day (QID) | INTRAVENOUS | Status: DC | PRN
  Administered 2023-10-21: 12.5 mg via INTRAVENOUS
  Filled 2023-10-21: qty 12.5

## 2023-10-21 MED ORDER — INSULIN ASPART 100 UNIT/ML IJ SOLN
0.0000 [IU] | Freq: Three times a day (TID) | INTRAMUSCULAR | Status: DC
  Administered 2023-10-21: 2 [IU] via SUBCUTANEOUS
  Filled 2023-10-21: qty 0.15

## 2023-10-21 MED ORDER — SODIUM CHLORIDE 0.9 % IV BOLUS
1000.0000 mL | Freq: Once | INTRAVENOUS | Status: AC
  Administered 2023-10-21: 1000 mL via INTRAVENOUS

## 2023-10-21 MED ORDER — POTASSIUM CHLORIDE 10 MEQ/100ML IV SOLN
10.0000 meq | INTRAVENOUS | Status: AC
  Administered 2023-10-21 (×2): 10 meq via INTRAVENOUS
  Filled 2023-10-21 (×2): qty 100

## 2023-10-21 MED ORDER — PANTOPRAZOLE SODIUM 40 MG IV SOLR
40.0000 mg | INTRAVENOUS | Status: DC
  Administered 2023-10-21 – 2023-10-22 (×2): 40 mg via INTRAVENOUS
  Filled 2023-10-21 (×3): qty 10

## 2023-10-21 MED ORDER — ONDANSETRON HCL 4 MG/2ML IJ SOLN
4.0000 mg | Freq: Four times a day (QID) | INTRAMUSCULAR | Status: DC | PRN
  Administered 2023-10-21 – 2023-10-22 (×3): 4 mg via INTRAVENOUS
  Filled 2023-10-21 (×3): qty 2

## 2023-10-21 MED ORDER — ENOXAPARIN SODIUM 40 MG/0.4ML IJ SOSY
40.0000 mg | PREFILLED_SYRINGE | INTRAMUSCULAR | Status: DC
  Administered 2023-10-22: 40 mg via SUBCUTANEOUS
  Filled 2023-10-21: qty 0.4

## 2023-10-21 MED ORDER — KETOROLAC TROMETHAMINE 15 MG/ML IJ SOLN
15.0000 mg | Freq: Once | INTRAMUSCULAR | Status: AC
  Administered 2023-10-21: 15 mg via INTRAVENOUS
  Filled 2023-10-21: qty 1

## 2023-10-21 MED ORDER — INSULIN ASPART 100 UNIT/ML IJ SOLN
0.0000 [IU] | Freq: Every day | INTRAMUSCULAR | Status: DC
Start: 2023-10-21 — End: 2023-10-22
  Filled 2023-10-21: qty 0.05

## 2023-10-21 MED ORDER — SODIUM CHLORIDE 0.9% FLUSH
3.0000 mL | Freq: Two times a day (BID) | INTRAVENOUS | Status: DC
  Administered 2023-10-21 – 2023-10-22 (×3): 3 mL via INTRAVENOUS

## 2023-10-21 MED ORDER — ALUM & MAG HYDROXIDE-SIMETH 200-200-20 MG/5ML PO SUSP
30.0000 mL | ORAL | Status: DC | PRN
  Administered 2023-10-21: 30 mL via ORAL
  Filled 2023-10-21: qty 30

## 2023-10-21 MED ORDER — ACETAMINOPHEN 650 MG RE SUPP: 650.0000 mg | Freq: Four times a day (QID) | RECTAL | Status: DC | PRN

## 2023-10-21 MED ORDER — METOCLOPRAMIDE HCL 5 MG/ML IJ SOLN
10.0000 mg | Freq: Three times a day (TID) | INTRAMUSCULAR | Status: DC | PRN
  Administered 2023-10-21 – 2023-10-22 (×2): 10 mg via INTRAVENOUS
  Filled 2023-10-21 (×2): qty 2

## 2023-10-21 MED ORDER — LACTATED RINGERS IV SOLN: INTRAVENOUS | Status: AC

## 2023-10-21 MED ORDER — ONDANSETRON 4 MG PO TBDP
4.0000 mg | ORAL_TABLET | Freq: Once | ORAL | Status: AC | PRN
  Administered 2023-10-21: 4 mg via ORAL
  Filled 2023-10-21: qty 1

## 2023-10-21 NOTE — ED Provider Notes (Signed)
 [place] EMERGENCY DEPARTMENT AT [facility] Provider Note   CSN: [id number] Arrival date & time: 10/21/23  1048     History  Chief Complaint  Patient presents with   Abdominal Pain   Nausea    [name] [name] is a 53 y.o. male.  The history is provided by the patient, a parent, the spouse and medical records. No language interpreter was used.  Abdominal Pain    [name] [name] is a 54 year old male that presents to [facility] ED today for nausea, vomiting, and abdominal cramping. PMH is significant for DM1 with uncontrolled blood sugars.Patient has been struggling with this for many months and states he takes Ozempic and believes this is the cause. He stopped taking Ozempic 1 month ago, but states that his blood sugar was running so high that he gave himself an injection at the beginning of the week (lowest dose). He has not had a BM since before this dose. He began experiencing these symptoms at the beginning of the week and presented to [facility] ED on 3/5 for the same symptoms where he was provided IVF and symptomatic treatment. Patient presents again today for continued symptoms, with no relief. He states he may have had a syncopal episode last night while walking to the bathroom due to extreme weakness and dehydration. Patient has not been able to tolerate anything by mouth and has not taken his PO medications or insulin over the past few days due to weakness. Patient denies CP, SOB, palpitations, diarrhea, or BM.   Home Medications Prior to Admission medications   Medication Sig Start Date End Date Taking? Authorizing Provider  atorvastatin (LIPITOR) 20 MG tablet Take 20 mg by mouth daily. Patient not taking: Reported on 08/19/2023    [provider]  cholecalciferol (VITAMIN D) 1000 UNITS tablet Take 1,000 Units by mouth daily.    [provider]  erythromycin (ERY-TAB) 500 MG EC tablet Take 500 mg by mouth 3 (three) times daily. 08/11/23   [provider]   gabapentin (NEURONTIN) 600 MG tablet Take 1 tablet (600 mg total) by mouth 3 (three) times daily. Patient not taking: Reported on 08/19/2023 01/15/15   Huston Foley, MD  Insulin Aspart FlexPen (NOVOLOG) 100 UNIT/ML Inject 0-60 Units into the skin daily. 07/10/19   [provider]  metoCLOPramide (REGLAN) 10 MG tablet Take 1 tablet (10 mg total) by mouth every 8 (eight) hours as needed for nausea. 08/20/23   Lewie Chamber, MD  metoCLOPramide (REGLAN) 10 MG tablet Take 1 tablet (10 mg total) by mouth every 8 (eight) hours as needed for nausea or vomiting. 10/19/23 11/18/23  Judithann Sheen, PA  ondansetron (ZOFRAN-ODT) 4 MG disintegrating tablet Take 1 tablet (4 mg total) by mouth every 8 (eight) hours as needed for nausea or vomiting. 08/20/23   Lewie Chamber, MD  ondansetron (ZOFRAN-ODT) 4 MG disintegrating tablet Take 1 tablet (4 mg total) by mouth every 8 (eight) hours as needed for nausea or vomiting. 10/19/23   Judithann Sheen, PA  OZEMPIC, 2 MG/DOSE, 8 MG/3ML SOPN Inject 2 mg into the skin once a week. 06/22/23 06/21/24  [provider]      Allergies    Metformin    Review of Systems   Review of Systems  Gastrointestinal:  Positive for abdominal pain.  All other systems reviewed and are negative.   Physical Exam Updated Vital Signs BP (!) 160/99   Pulse 97   Temp 98.1 F (36.7 C)  Resp 15   Ht 5\' 10"  (1.778 m)   Wt 91 kg   SpO2 100%   BMI 28.79 kg/m  Physical Exam Vitals and nursing note reviewed.  Constitutional:      General: He is not in acute distress.    Appearance: He is well-developed.     Comments: Patient laying in a left lateral decubitus position appears uncomfortable  HENT:     Head: Atraumatic.     Mouth/Throat:     Comments: Mouth dry Eyes:     Conjunctiva/sclera: Conjunctivae normal.  Cardiovascular:     Rate and Rhythm: Normal rate and regular rhythm.  Pulmonary:     Effort: Pulmonary effort is normal.     Breath sounds: Normal breath  sounds.  Abdominal:     Tenderness: There is generalized abdominal tenderness.     Hernia: No hernia is present.  Musculoskeletal:     Cervical back: Neck supple.  Skin:    Findings: No rash.  Neurological:     Mental Status: He is alert.     ED Results / Procedures / Treatments   Labs (all labs ordered are listed, but only abnormal results are displayed) Labs Reviewed  LIPASE, BLOOD - Abnormal; Notable for the following components:      Result Value   Lipase 55 (*)    All other components within normal limits  COMPREHENSIVE METABOLIC PANEL - Abnormal; Notable for the following components:   Sodium 134 (*)    Potassium 3.4 (*)    Chloride 97 (*)    Glucose, Bld 204 (*)    BUN 32 (*)    Calcium 8.6 (*)    Total Bilirubin 2.9 (*)    All other components within normal limits  CBC - Abnormal; Notable for the following components:   WBC 14.2 (*)    RBC 6.09 (*)    Hemoglobin 17.5 (*)    HCT 54.6 (*)    All other components within normal limits  CBG MONITORING, ED - Abnormal; Notable for the following components:   Glucose-Capillary 190 (*)    All other components within normal limits  URINALYSIS, ROUTINE W REFLEX MICROSCOPIC    EKG None  Radiology CT ABDOMEN PELVIS W CONTRAST Result Date: 10/19/2023 CLINICAL DATA:  Abdominal pain, acute, nonlocalized. Emesis as for 2 days following restarting Ozempic. EXAM: CT ABDOMEN AND PELVIS WITH CONTRAST TECHNIQUE: Multidetector CT imaging of the abdomen and pelvis was performed using the standard protocol following bolus administration of intravenous contrast. RADIATION DOSE REDUCTION: This exam was performed according to the departmental dose-optimization program which includes automated exposure control, adjustment of the mA and/or kV according to patient size and/or use of iterative reconstruction technique. CONTRAST:  OMNIPAQUE IOHEXOL 300 MG/ML  SOLN COMPARISON:  MRCP 08/19/2023. CT abdomen and pelvis with contrast  08/19/2023. FINDINGS: Lower chest: The lung bases are clear without focal nodule, mass, or airspace disease. The heart size is normal. No significant pleural or pericardial effusion is present. Hepatobiliary: Diffuse fatty infiltration of the liver is present. The common bile duct and gallbladder are normal. No discrete lesions are present. Pancreas: Unremarkable. No pancreatic ductal dilatation or surrounding inflammatory changes. Spleen: Normal in size without focal abnormality. Adrenals/Urinary Tract: Adrenal glands are normal bilaterally. Bilateral simple cysts are stable, measuring 2.2 cm at the upper pole of the right kidney and 2.0 cm in the midportion of the left kidney. A 4 mm nonobstructing stone in the midportion of the right kidney is stable. A  3 mm stone at the upper pole of the right kidney is stable. A 3 mm stone at the lower pole of the left kidney is stable. Five additional smaller stones in the left kidney are also stable. No obstruction is present. The ureters are within normal limits. The urinary bladder is normal. Stomach/Bowel: The stomach is moderately distended. No mass lesion or focal inflammation is present. The duodenum is unremarkable. Small bowel is within normal limits. Terminal ileum is normal. The appendix is visualized and normal. The ascending and transverse colon are within normal limits. The descending and sigmoid colon are normal. Vascular/Lymphatic: Atherosclerotic calcifications are present in the aorta and branch vessels. No aneurysm is present. No significant adenopathy is present. Reproductive: Reservoir for penile prosthesis is noted in the right lower quadrant. Prostate gland is normal. Other: No abdominal wall hernia or abnormality. No abdominopelvic ascites. Musculoskeletal: The vertebral body heights and alignment are normal. No focal osseous lesions are present. Bony pelvis is normal. Hips are located and within normal limits bilaterally. IMPRESSION: 1. Moderate  distension of the stomach likely reflects relative delay in gastric outlet, potentially related to the Ozempic. 2. Stable bilateral nonobstructing renal stones. 3. Stable bilateral simple renal cysts. 4. Hepatic steatosis. Electronically Signed   By: Marin Roberts M.D.   On: 10/19/2023 17:11    Procedures Procedures    Medications Ordered in ED Medications  ondansetron (ZOFRAN-ODT) disintegrating tablet 4 mg (has no administration in time range)    ED Course/ Medical Decision Making/ A&P                                 Medical Decision Making Amount and/or Complexity of Data Reviewed Labs: ordered.  Risk Prescription drug management.   BP (!) 160/99   Pulse 97   Temp 98.1 F (36.7 C)   Resp 15   Ht 5\' 10"  (1.778 m)   Wt 91 kg   SpO2 100%   BMI 28.79 kg/m   2:04 PM  [name] [name] is a 53 year old male that presents to [facility] ED today for nausea, vomiting, and abdominal cramping. PMH is significant for DM1 with uncontrolled blood sugars.Patient has been struggling with this for many months and states he takes Ozempic and believes this is the cause. He stopped taking Ozempic 1 month ago, but states that his blood sugar was running so high that he gave himself an injection at the beginning of the week (lowest dose). He has not had a BM since before this dose. He began experiencing these symptoms at the beginning of the week and presented to [facility] ED on 3/5 for the same symptoms where he was provided IVF and symptomatic treatment. Patient presents again today for continued symptoms, with no relief. He states he may have had a syncopal episode last night while walking to the bathroom due to extreme weakness and dehydration. Patient has not been able to tolerate anything by mouth and has not taken his PO medications or insulin over the past few days due to weakness. Patient denies CP, SOB, palpitations, diarrhea, or BM.   Exam notable for a patient that is laying in bed appears  uncomfortable.  He does have tenderness to abdomen on palpation without focal point tenderness.  He appears dehydrated, mild distress.  Vital signs with elevated blood pressure 160/99.  No fever no hypoxia.  -Labs ordered, independently viewed and interpreted by me.  Labs remarkable for  hemoglobin of 17.5 suggestive of hemoconcentration from dehydration.  No other count of 14.2.  CBG is 204 without anion gap. -The patient was maintained on a cardiac monitor.  I personally viewed and interpreted the cardiac monitored which showed an underlying rhythm of: NSR -Imaging including abd/pelvis CT considered but pt had recent CT 2 days ago for same and it shows finding moderate distension of the stomach suggestive of Ozempic  -This patient presents to the ED for concern of n/v, this involves an extensive number of treatment options, and is a complaint that carries with it a high risk of complications and morbidity.  The differential diagnosis includes medication side effect, colitis, pancreatitis, cholecystitis, sbo, UTI, pneumonia -Co morbidities that complicate the patient evaluation includes DM -Treatment includes zofran, phenergan, IVF -Reevaluation of the patient after these medicines showed that the patient stayed the same -PCP office notes or outside notes reviewed -Discussion with oncoming team to admit pt -Escalation to admission/observation considered: patient agreeable with admission        Final Clinical Impression(s) / ED Diagnoses Final diagnoses:  None    Rx / DC Orders ED Discharge Orders     None         Fayrene Helper, PA-C 10/21/23 1500    Linwood Dibbles, MD 10/22/23 [phone]

## 2023-10-21 NOTE — Assessment & Plan Note (Addendum)
 CT abdomen was done 2 days ago.  Questionable gastroparesis at the time.  At this time patient is not a candidate for gastric emptying study given active nausea.  He will try to treat this with Reglan.  Empiric pantoprazole.  As well as Zofran.  Patient advised not to use marijuana as this may cause nausea vomiting episodes (among other health hazards).  Patient is concerned that his episodes are precipitated by Ozempic use.  It is certainly possible.  Although typically should not happen with chronic stable doses of Ozempic.  Given association with leukocytosis, I will check blood culture.  As well as stool for H. pylori.

## 2023-10-21 NOTE — Assessment & Plan Note (Signed)
 Subacute/chronic for 6 months. MRCP in chart, non actionable. A.w. only minimal lft abnoramlity. Check direct bilirubin. Hapto and LDH. Routine work up.

## 2023-10-21 NOTE — Assessment & Plan Note (Signed)
 X 6 months. Check erythropoetin.

## 2023-10-21 NOTE — ED Provider Notes (Signed)
 Received patient at signout from previous provider pending hospitalist consult.  See his note.  In short, patient presents to emergency department for evaluation of nausea, vomiting, diffuse abdominal pain that started on 10/18/2023 starting soon after restarting Ozempic for diabetes.  He sought ED evaluation yesterday for symptoms. CT was notable for moderate distention of the stomach likely reflects relative delayed gastric outlet potentially relating to Ozempic, stable bilateral nonobstructing renal stones, stable bilateral simple renal cyst, hepatic steatosis . Following workup, he reported improvement of symptoms following clonidine, droperidol, Zofran, GI cocktail, 1 L IVF and was agreeable to DC at that time.    Of note, He was admitted in the past 1 month ago with similar symptoms likely caused to Ozempic.  Today, he presents to the ED department for similar symptoms and intractable vomiting.  Today, he was provided Zofran, Phenergan, 1 L IVF and continues to feel nauseous with abdominal pain. ED workup significant for leukocytosis of 14.2, Hgb 17.5, CBG 190 without anion gap. This could be due to significant dehydration as WBC and Hgb are increased form yesterday. UDS + for tetrahydrocannabinol.   Dr. Nolberto Hanlon hospitalist accepts patient for admission for intractable N/V, dehydration.   Judithann Sheen, PA 10/21/23 1552    Benjiman Core, MD 10/21/23 [phone]

## 2023-10-21 NOTE — ED Triage Notes (Signed)
 Patient presented to ER for nausea, vomiting, abdominal pain x3 days. Patient states he was off ozempic for a month, but blood sugars were high so restarted it and has been vomiting ever since.

## 2023-10-21 NOTE — Assessment & Plan Note (Signed)
 Patint wants to avoid ozempic. Rx with sliding scale at this time.

## 2023-10-21 NOTE — H&P (Signed)
 History and Physical    Patient: [name] [name] JXB:[id number] DOB: [birth year] DOA: 10/21/2023 DOS: the patient was seen and examined on 10/21/2023 PCP: System, Provider Not In  Patient coming from: Home  Chief Complaint:  Chief Complaint  Patient presents with   Abdominal Pain   Nausea   HPI: [name] T Kempen is a 53 y.o. male with medical history significant of diabetes mellitus and other medical issues as listed below.  Patient describes episodes of having nausea vomiting and abdominal discomfort over the years with normal and intervals in between.  Patient was in his usual state of health till about 4 days ago when he had new onset of generalized abdominal discomfort feeling hot vomiting and nausea.  Patient denies diarrhea any fever.  Patient's p.o. intake has markedly been reduced.  Patient has been retching when not actively vomiting.  Patient was seen in the ER on March 5 2 days ago  CT abd : 1. Moderate distension of the stomach likely reflects relative delay in gastric outlet, potentially related to the Ozempic. 2. Stable bilateral nonobstructing renal stones. 3. Stable bilateral simple renal cysts. 4. Hepatic steatosis.  Patinet was dc ffrom the ER at the time. However, returns this AM with ongoign above complatins. Unable to tolerate diet. Patient corelates his symptoms to ozempic use. Review of Systems: As mentioned in the history of present illness. All other systems reviewed and are negative.  Past Medical History:  Diagnosis Date   Diabetes mellitus type II, uncontrolled    diagnosed age 73yo; week long hospitalization at diagnosis   Hyperlipidemia    Insomnia    Microalbuminuria    Migraine    Unspecified disorder of skin and subcutaneous tissue    recurrent summertime rash, large lesions scattered?   Wears glasses    Past Surgical History:  Procedure Laterality Date   VASECTOMY     Social History:  reports that he has never smoked. He does not have any smokeless  tobacco history on file. He reports current alcohol use. He reports that he does not use drugs. 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 Allergies  Allergen Reactions   Metformin Nausea And Vomiting and Nausea Only    Other reaction(s): Constipation (disorder), Vomiting (disorder), Nausea (finding), Vomiting   Ozempic (0.25 Or 0.5 Mg-Dose) [Semaglutide(0.25 Or 0.5mg -Dos)] Nausea And Vomiting    Stomach cramps     Family History  Problem Relation Age of Onset   Diabetes Maternal Aunt    Diabetes Maternal Uncle    Diabetes Maternal Grandmother    Heart disease Neg Hx    Stroke Neg Hx    Cancer Neg Hx     Prior to Admission medications   Medication Sig Start Date End Date Taking? Authorizing Provider  Insulin Aspart FlexPen (NOVOLOG) 100 UNIT/ML Inject 6 Units into the skin daily as needed (high blood sugar). 07/10/19  Yes [provider]  metoCLOPramide (REGLAN) 10 MG tablet Take 1 tablet (10 mg total) by mouth every 8 (eight) hours as needed for nausea or vomiting. 10/19/23 11/18/23 Yes Judithann Sheen, PA  ondansetron (ZOFRAN-ODT) 4 MG disintegrating tablet Take 1 tablet (4 mg total) by mouth every 8 (eight) hours as needed for nausea or vomiting. 08/20/23  Yes Lewie Chamber, MD  OZEMPIC, 2 MG/DOSE, 8 MG/3ML SOPN Inject 2 mg into the skin once a week. 06/22/23 06/21/24 Yes [provider]  erythromycin (ERY-TAB) 500 MG EC tablet Take 500 mg by mouth 3 (three) times daily. Patient  not taking: Reported on 10/21/2023 08/11/23   [provider]  gabapentin (NEURONTIN) 600 MG tablet Take 1 tablet (600 mg total) by mouth 3 (three) times daily. Patient not taking: Reported on 08/19/2023 01/15/15   Huston Foley, MD    Physical Exam: Vitals:   10/21/23 1612 10/21/23 1700 10/21/23 1730 10/21/23 1830  BP:  122/68 (!) 154/91 (!) 162/84  Pulse:  76 70 74  Resp:  (!) 21 17 13   Temp: 98.8 F (37.1 C)     TempSrc: Oral     SpO2:  97% 96% 98%  Weight:      Height:        General: Patient is restful appears to be in no distress.  Fully oriented Girlfriend at bedside Respiratory exam: Bilateral intravesicular Cardiovascular exam S1-S2 normal Abdomen all quadrants are soft, tenderness to deep palpation without any guarding or rebound. Extremities warm without edema no focal deficit. Data Reviewed:  Labs on Admission:  Results for orders placed or performed during the hospital encounter of 10/21/23 (from the past 24 hours)  POC CBG, ED     Status: Abnormal   Collection Time: 10/21/23 11:18 AM  Result Value Ref Range   Glucose-Capillary 190 (H) 70 - 99 mg/dL  Lipase, blood     Status: Abnormal   Collection Time: 10/21/23 11:21 AM  Result Value Ref Range   Lipase 55 (H) 11 - 51 U/L  Comprehensive metabolic panel     Status: Abnormal   Collection Time: 10/21/23 11:21 AM  Result Value Ref Range   Sodium 134 (L) 135 - 145 mmol/L   Potassium 3.4 (L) 3.5 - 5.1 mmol/L   Chloride 97 (L) 98 - 111 mmol/L   CO2 25 22 - 32 mmol/L   Glucose, Bld 204 (H) 70 - 99 mg/dL   BUN 32 (H) 6 - 20 mg/dL   Creatinine, Ser 1.61 0.61 - 1.24 mg/dL   Calcium 8.6 (L) 8.9 - 10.3 mg/dL   Total Protein 7.6 6.5 - 8.1 g/dL   Albumin 4.0 3.5 - 5.0 g/dL   AST 28 15 - 41 U/L   ALT 23 0 - 44 U/L   Alkaline Phosphatase 86 38 - 126 U/L   Total Bilirubin 2.9 (H) 0.0 - 1.2 mg/dL   GFR, Estimated >09 >60 mL/min   Anion gap 12 5 - 15  CBC     Status: Abnormal   Collection Time: 10/21/23 11:21 AM  Result Value Ref Range   WBC 14.2 (H) 4.0 - 10.5 K/uL   RBC 6.09 (H) 4.22 - 5.81 MIL/uL   Hemoglobin 17.5 (H) 13.0 - 17.0 g/dL   HCT 45.4 (H) 09.8 - 11.9 %   MCV 89.7 80.0 - 100.0 fL   MCH 28.7 26.0 - 34.0 pg   MCHC 32.1 30.0 - 36.0 g/dL   RDW 14.7 82.9 - 56.2 %   Platelets 311 150 - 400 K/uL   nRBC 0.0 0.0 - 0.2 %  Urinalysis, Routine w reflex microscopic -Urine, Clean Catch     Status: Abnormal   Collection Time: 10/21/23  2:48 PM  Result Value Ref Range   Color, Urine YELLOW  YELLOW   APPearance CLEAR CLEAR   Specific Gravity, Urine 1.026 1.005 - 1.030   pH 5.0 5.0 - 8.0   Glucose, UA NEGATIVE NEGATIVE mg/dL   Hgb urine dipstick SMALL (A) NEGATIVE   Bilirubin Urine NEGATIVE NEGATIVE   Ketones, ur 5 (A) NEGATIVE mg/dL   Protein, ur 130 (A)  NEGATIVE mg/dL   Nitrite NEGATIVE NEGATIVE   Leukocytes,Ua NEGATIVE NEGATIVE   RBC / HPF 0-5 0 - 5 RBC/hpf   WBC, UA 0-5 0 - 5 WBC/hpf   Bacteria, UA NONE SEEN NONE SEEN   Squamous Epithelial / HPF 0-5 0 - 5 /HPF   Mucus PRESENT   Rapid urine drug screen (hospital performed)     Status: Abnormal   Collection Time: 10/21/23  2:48 PM  Result Value Ref Range   Opiates NONE DETECTED NONE DETECTED   Cocaine NONE DETECTED NONE DETECTED   Benzodiazepines NONE DETECTED NONE DETECTED   Amphetamines NONE DETECTED NONE DETECTED   Tetrahydrocannabinol POSITIVE (A) NONE DETECTED   Barbiturates NONE DETECTED NONE DETECTED  CBG monitoring, ED     Status: Abnormal   Collection Time: 10/21/23  5:50 PM  Result Value Ref Range   Glucose-Capillary 133 (H) 70 - 99 mg/dL   Basic Metabolic Panel: Recent Labs  Lab 10/19/23 0454 10/21/23 1121  NA 134* 134*  K 3.7 3.4*  CL 96* 97*  CO2 22 25  GLUCOSE 214* 204*  BUN 21* 32*  CREATININE 1.20 1.21  CALCIUM 9.6 8.6*   Liver Function Tests: Recent Labs  Lab 10/19/23 0454 10/21/23 1121  AST 42* 28  ALT 32 23  ALKPHOS 103 86  BILITOT 2.6* 2.9*  PROT 8.4* 7.6  ALBUMIN 4.5 4.0   Recent Labs  Lab 10/19/23 0454 10/21/23 1121  LIPASE 27 55*   No results for input(s): "AMMONIA" in the last 168 hours. CBC: Recent Labs  Lab 10/19/23 0454 10/21/23 1121  WBC 11.9* 14.2*  HGB 17.6* 17.5*  HCT 53.6* 54.6*  MCV 88.7 89.7  PLT 345 311   Cardiac Enzymes: Recent Labs  Lab 10/19/23 1300  TROPONINIHS 17    BNP (last 3 results) No results for input(s): "PROBNP" in the last 8760 hours. CBG: Recent Labs  Lab 10/19/23 1112 10/21/23 1118 10/21/23 1750  GLUCAP 189* 190*  133*    Radiological Exams on Admission:    I/O last 3 completed shifts: In: 1050 [IV Piggyback:1050] Out: -  No intake/output data recorded.    Assessment and Plan: * Vomiting CT abdomen was done 2 days ago.  Questionable gastroparesis at the time.  At this time patient is not a candidate for gastric emptying study given active nausea.  He will try to treat this with Reglan.  Empiric pantoprazole.  As well as Zofran.  Patient advised not to use marijuana as this may cause nausea vomiting episodes (among other health hazards).  Patient is concerned that his episodes are precipitated by Ozempic use.  It is certainly possible.  Although typically should not happen with chronic stable doses of Ozempic.  Given association with leukocytosis, I will check blood culture.  As well as stool for H. pylori.  Erythrocytosis X 6 months. Check erythropoetin.  Bilirubinemia Subacute/chronic for 6 months. MRCP in chart, non actionable. A.w. only minimal lft abnoramlity. Check direct bilirubin. Hapto and LDH. Routine work up.  DMII (diabetes mellitus, type 2) (HCC) Patint wants to avoid ozempic. Rx with sliding scale at this time.   Tropoin pending.   Advance Care Planning:   Code Status: Full Code   Consults: none  Family Communication: girlfiend at bedsdie.  Severity of Illness: The appropriate patient status for this patient is OBSERVATION. Observation status is judged to be reasonable and necessary in order to provide the required intensity of service to ensure the patient's safety. The patient's presenting symptoms,  physical exam findings, and initial radiographic and laboratory data in the context of their medical condition is felt to place them at decreased risk for further clinical deterioration. Furthermore, it is anticipated that the patient will be medically stable for discharge from the hospital within 2 midnights of admission.   Author: Nolberto Hanlon, MD 10/21/2023 7:21 PM  For on  call review [email or web address].

## 2023-10-22 DIAGNOSIS — R112 Nausea with vomiting, unspecified: Secondary | ICD-10-CM | POA: Diagnosis not present

## 2023-10-22 LAB — BASIC METABOLIC PANEL
Anion gap: 9 (ref 5–15)
BUN: 32 mg/dL — ABNORMAL HIGH (ref 6–20)
CO2: 26 mmol/L (ref 22–32)
Calcium: 8.4 mg/dL — ABNORMAL LOW (ref 8.9–10.3)
Chloride: 102 mmol/L (ref 98–111)
Creatinine, Ser: 1.14 mg/dL (ref 0.61–1.24)
GFR, Estimated: >60 mL/min (ref >60)
Glucose, Bld: 125 mg/dL — ABNORMAL HIGH (ref 70–99)
Potassium: 3.5 mmol/L (ref 3.5–5.1)
Sodium: 137 mmol/L (ref 135–145)

## 2023-10-22 LAB — PROTIME-INR
INR: 1.1 (ref 0.8–1.2)
Prothrombin Time: 14.9 s (ref 11.4–15.2)

## 2023-10-22 LAB — CBC
HCT: 49.2 % (ref 39.0–52.0)
Hemoglobin: 15.5 g/dL (ref 13.0–17.0)
MCH: 28.8 pg (ref 26.0–34.0)
MCHC: 31.5 g/dL (ref 30.0–36.0)
MCV: 91.3 fL (ref 80.0–100.0)
Platelets: 258 10*3/uL (ref 150–400)
RBC: 5.39 MIL/uL (ref 4.22–5.81)
RDW: 13.1 % (ref 11.5–15.5)
WBC: 10.1 10*3/uL (ref 4.0–10.5)
nRBC: 0 % (ref 0.0–0.2)

## 2023-10-22 LAB — APTT: aPTT: 30 s (ref 24–36)

## 2023-10-22 LAB — GLUCOSE, CAPILLARY
Glucose-Capillary: 114 mg/dL — ABNORMAL HIGH (ref 70–99)
Glucose-Capillary: 148 mg/dL — ABNORMAL HIGH (ref 70–99)
Glucose-Capillary: 122 mg/dL — ABNORMAL HIGH (ref 70–99)

## 2023-10-22 LAB — LACTATE DEHYDROGENASE: LDH: 125 U/L (ref 98–192)

## 2023-10-22 LAB — BILIRUBIN, DIRECT: Bilirubin, Direct: 0.3 mg/dL — ABNORMAL HIGH (ref 0.0–0.2)

## 2023-10-22 MED ORDER — ONDANSETRON 4 MG PO TBDP: 4.0000 mg | ORAL_TABLET | Freq: Three times a day (TID) | ORAL | 1 refills | Status: DC | PRN

## 2023-10-22 MED ORDER — LACTATED RINGERS IV BOLUS
500.0000 mL | Freq: Once | INTRAVENOUS | Status: AC
Start: 2023-10-22 — End: 2023-10-22
  Administered 2023-10-22: 500 mL via INTRAVENOUS

## 2023-10-22 MED ORDER — ENSURE ENLIVE PO LIQD: 237.0000 mL | Freq: Two times a day (BID) | ORAL | Status: DC

## 2023-10-22 MED ORDER — METOCLOPRAMIDE HCL 5 MG/ML IJ SOLN
10.0000 mg | Freq: Once | INTRAMUSCULAR | Status: AC
  Administered 2023-10-22: 10 mg via INTRAVENOUS
  Filled 2023-10-22: qty 2

## 2023-10-22 MED ORDER — METOCLOPRAMIDE HCL 10 MG PO TABS: 10.0000 mg | ORAL_TABLET | Freq: Three times a day (TID) | ORAL | 0 refills | Status: DC | PRN

## 2023-10-22 MED ORDER — HALOPERIDOL LACTATE 5 MG/ML IJ SOLN: 1.0000 mg | Freq: Once | INTRAMUSCULAR | Status: DC | PRN

## 2023-10-22 NOTE — Progress Notes (Signed)
   10/22/23 1304  TOC Brief Assessment  Insurance and Status Reviewed  Patient has primary care physician Yes  Home environment has been reviewed home w/ spouse  Prior level of function: independent  Prior/Current Home Services No current home services  Social Drivers of Health Review SDOH reviewed no interventions necessary  Readmission risk has been reviewed Yes  Transition of care needs no transition of care needs at this time

## 2023-10-22 NOTE — Plan of Care (Signed)
 Patient stable for discharge as per MD order. Patient verbalized understanding of discharge instruction.  Problem: Education: Goal: Ability to describe self-care measures that may prevent or decrease complications (Diabetes Survival Skills Education) will improve Outcome: Adequate for Discharge Goal: Individualized Educational Video(s) Outcome: Adequate for Discharge   Problem: Coping: Goal: Ability to adjust to condition or change in health will improve Outcome: Adequate for Discharge   Problem: Fluid Volume: Goal: Ability to maintain a balanced intake and output will improve Outcome: Adequate for Discharge   Problem: Health Behavior/Discharge Planning: Goal: Ability to identify and utilize available resources and services will improve Outcome: Adequate for Discharge Goal: Ability to manage health-related needs will improve Outcome: Adequate for Discharge   Problem: Metabolic: Goal: Ability to maintain appropriate glucose levels will improve Outcome: Adequate for Discharge   Problem: Nutritional: Goal: Maintenance of adequate nutrition will improve Outcome: Adequate for Discharge Goal: Progress toward achieving an optimal weight will improve Outcome: Adequate for Discharge   Problem: Skin Integrity: Goal: Risk for impaired skin integrity will decrease Outcome: Adequate for Discharge   Problem: Tissue Perfusion: Goal: Adequacy of tissue perfusion will improve Outcome: Adequate for Discharge   Problem: Education: Goal: Knowledge of General Education information will improve Description: Including pain rating scale, medication(s)/side effects and non-pharmacologic comfort measures Outcome: Adequate for Discharge   Problem: Health Behavior/Discharge Planning: Goal: Ability to manage health-related needs will improve Outcome: Adequate for Discharge   Problem: Clinical Measurements: Goal: Ability to maintain clinical measurements within normal limits will improve Outcome:  Adequate for Discharge Goal: Will remain free from infection Outcome: Adequate for Discharge Goal: Diagnostic test results will improve Outcome: Adequate for Discharge Goal: Respiratory complications will improve Outcome: Adequate for Discharge Goal: Cardiovascular complication will be avoided Outcome: Adequate for Discharge   Problem: Activity: Goal: Risk for activity intolerance will decrease Outcome: Adequate for Discharge   Problem: Nutrition: Goal: Adequate nutrition will be maintained Outcome: Adequate for Discharge   Problem: Coping: Goal: Level of anxiety will decrease Outcome: Adequate for Discharge   Problem: Elimination: Goal: Will not experience complications related to bowel motility Outcome: Adequate for Discharge Goal: Will not experience complications related to urinary retention Outcome: Adequate for Discharge   Problem: Pain Managment: Goal: General experience of comfort will improve and/or be controlled Outcome: Adequate for Discharge   Problem: Safety: Goal: Ability to remain free from injury will improve Outcome: Adequate for Discharge   Problem: Skin Integrity: Goal: Risk for impaired skin integrity will decrease Outcome: Adequate for Discharge

## 2023-10-22 NOTE — Plan of Care (Signed)

## 2023-10-22 NOTE — Progress Notes (Addendum)
 Pt was motivated for discharge this morning.  However after lunch, his severe nausea and vomiting has returned.  Discussion with him and his mother over the phone.  Both have decided he is not quite ready for discharge at this time.  Will rescind DC order for now, give him another night of supportive care.    Addendum: 1709  Pt is now ready for DC.  Will reinstate DC order.

## 2023-10-22 NOTE — Discharge Summary (Signed)
 Physician Discharge Summary   Patient: [name] [name] MRN: [id number] DOB: 09/01/1970  Admit date:     10/21/2023  Discharge date: 10/22/23  Discharge Physician: Deanna Artis   PCP: System, Provider Not In   Recommendations at discharge:   At this time patient will be discharged home.  If you experience any symptoms such as fever, vomiting, shortness of breath, chest pain, abdominal pain, or other concerning symptoms, please call your primary care provider or go to the emergency department immediately.  Discharge Diagnoses: Principal Problem:   Vomiting Active Problems:   DMII (diabetes mellitus, type 2) (HCC)   Bilirubinemia   Erythrocytosis  Resolved Problems:   * No resolved hospital problems. [facility] Hospital Course: No notes on file  Assessment and Plan:  Intractable nausea vomiting secondary to gastroparesis - Likely etiology of Ozempic use.  Patient stated that he had not taken Ozempic for couple months then restarted his full dose leading to worsening nausea and vomiting.  Currently feels markedly improved after IV fluid hydration.  Recommend patient slowly resume diabetic type diet.  Will give prescription for Reglan and Zofran to take as directed.  Recommend follow-up with PCP or endocrinologist in the outpatient setting.  Recommend discontinuing Ozempic for now.  Dehydration - Secondary to above.  Showing improvement after IV fluid resuscitation.  Insulin-dependent diabetes mellitus - As mentioned above, discontinue Ozempic for now.  Patient will need to follow-up with PCP/endocrinology in the outpatient setting to titrate diabetic regimen.  A1c last checked January 3 was 5.7 suggesting excellent control.        Consultants: None Procedures performed: None Disposition: Home Diet recommendation:  Discharge Diet Orders (From admission, onward)     Start     Ordered   10/22/23 0000  Diet - low sodium heart healthy        10/22/23 1049           Carb  modified diet DISCHARGE MEDICATION: Allergies as of 10/22/2023       Reactions   Metformin Nausea And Vomiting, Nausea Only   Other reaction(s): Constipation (disorder), Vomiting (disorder), Nausea (finding), Vomiting   Ozempic (0.25 Or 0.5 Mg-dose) [semaglutide(0.25 Or 0.5mg -dos)] Nausea And Vomiting   Stomach cramps         Medication List     STOP taking these medications    erythromycin 500 MG EC tablet Commonly known as: ERY-TAB   gabapentin 600 MG tablet Commonly known as: NEURONTIN   Ozempic (2 MG/DOSE) 8 MG/3ML Sopn Generic drug: Semaglutide (2 MG/DOSE)       TAKE these medications    Insulin Aspart FlexPen 100 UNIT/ML Commonly known as: NOVOLOG Inject 6 Units into the skin daily as needed (high blood sugar).   metoCLOPramide 10 MG tablet Commonly known as: REGLAN Take 1 tablet (10 mg total) by mouth every 8 (eight) hours as needed for nausea or vomiting.   ondansetron 4 MG disintegrating tablet Commonly known as: ZOFRAN-ODT Take 1 tablet (4 mg total) by mouth every 8 (eight) hours as needed for nausea or vomiting.        Discharge Exam: Filed Weights   10/21/23 1057  Weight: 91 kg   GENERAL:  Alert, pleasant, no acute distress  HEENT:  EOMI CARDIOVASCULAR:  RRR, no murmurs appreciated RESPIRATORY:  Clear to auscultation, no wheezing, rales, or rhonchi GASTROINTESTINAL:  Soft, nontender, nondistended EXTREMITIES:  No LE edema bilaterally NEURO:  No new focal deficits appreciated SKIN:  No rashes noted PSYCH:  Appropriate  mood and affect    Condition at discharge: improving  The results of significant diagnostics from this hospitalization (including imaging, microbiology, ancillary and laboratory) are listed below for reference.   Imaging Studies: CT ABDOMEN PELVIS W CONTRAST Result Date: 10/19/2023 CLINICAL DATA:  Abdominal pain, acute, nonlocalized. Emesis as for 2 days following restarting Ozempic. EXAM: CT ABDOMEN AND PELVIS WITH CONTRAST  TECHNIQUE: Multidetector CT imaging of the abdomen and pelvis was performed using the standard protocol following bolus administration of intravenous contrast. RADIATION DOSE REDUCTION: This exam was performed according to the departmental dose-optimization program which includes automated exposure control, adjustment of the mA and/or kV according to patient size and/or use of iterative reconstruction technique. CONTRAST:  OMNIPAQUE IOHEXOL 300 MG/ML  SOLN COMPARISON:  MRCP 08/19/2023. CT abdomen and pelvis with contrast 08/19/2023. FINDINGS: Lower chest: The lung bases are clear without focal nodule, mass, or airspace disease. The heart size is normal. No significant pleural or pericardial effusion is present. Hepatobiliary: Diffuse fatty infiltration of the liver is present. The common bile duct and gallbladder are normal. No discrete lesions are present. Pancreas: Unremarkable. No pancreatic ductal dilatation or surrounding inflammatory changes. Spleen: Normal in size without focal abnormality. Adrenals/Urinary Tract: Adrenal glands are normal bilaterally. Bilateral simple cysts are stable, measuring 2.2 cm at the upper pole of the right kidney and 2.0 cm in the midportion of the left kidney. A 4 mm nonobstructing stone in the midportion of the right kidney is stable. A 3 mm stone at the upper pole of the right kidney is stable. A 3 mm stone at the lower pole of the left kidney is stable. Five additional smaller stones in the left kidney are also stable. No obstruction is present. The ureters are within normal limits. The urinary bladder is normal. Stomach/Bowel: The stomach is moderately distended. No mass lesion or focal inflammation is present. The duodenum is unremarkable. Small bowel is within normal limits. Terminal ileum is normal. The appendix is visualized and normal. The ascending and transverse colon are within normal limits. The descending and sigmoid colon are normal. Vascular/Lymphatic:  Atherosclerotic calcifications are present in the aorta and branch vessels. No aneurysm is present. No significant adenopathy is present. Reproductive: Reservoir for penile prosthesis is noted in the right lower quadrant. Prostate gland is normal. Other: No abdominal wall hernia or abnormality. No abdominopelvic ascites. Musculoskeletal: The vertebral body heights and alignment are normal. No focal osseous lesions are present. Bony pelvis is normal. Hips are located and within normal limits bilaterally. IMPRESSION: 1. Moderate distension of the stomach likely reflects relative delay in gastric outlet, potentially related to the Ozempic. 2. Stable bilateral nonobstructing renal stones. 3. Stable bilateral simple renal cysts. 4. Hepatic steatosis. Electronically Signed   By: Marin Roberts M.D.   On: 10/19/2023 17:11    Microbiology: Results for orders placed or performed during the hospital encounter of 10/21/23  Culture, blood (Routine X 2) w Reflex to ID Panel     Status: None (Preliminary result)   Collection Time: 10/21/23  8:14 PM   Specimen: BLOOD  Result Value Ref Range Status   Specimen Description   Final    BLOOD BLOOD LEFT FOREARM Performed at [facility], [address] [place], [place] [address]    Special Requests   Final    BOTTLES DRAWN AEROBIC AND ANAEROBIC Blood Culture adequate volume Performed at [facility], [address] [place], [place] [address]    Culture   Final  NO GROWTH < 12 HOURS Performed at [facility], [address], [place], [place] [address]    Report Status PENDING  Incomplete  Culture, blood (Routine X 2) w Reflex to ID Panel     Status: None (Preliminary result)   Collection Time: 10/21/23 10:12 PM   Specimen: BLOOD  Result Value Ref Range Status   Specimen Description   Final    BLOOD BLOOD RIGHT HAND Performed at [facility], [address], [place], [place] [address]     Special Requests   Final    BOTTLES DRAWN AEROBIC ONLY Blood Culture results may not be optimal due to an inadequate volume of blood received in culture bottles Performed at [facility], [address], [place], [place] [address]    Culture   Final    NO GROWTH < 12 HOURS Performed at [facility], [address] [place], [place] [address]    Report Status PENDING  Incomplete    Labs: CBC: Recent Labs  Lab 10/19/23 0454 10/21/23 1121 10/21/23 2014 10/22/23 0526  WBC 11.9* 14.2* 12.1* 10.1  HGB 17.6* 17.5* 15.5 15.5  HCT 53.6* 54.6* 48.3 49.2  MCV 88.7 89.7 89.0 91.3  PLT 345 311 308 258   Basic Metabolic Panel: Recent Labs  Lab 10/19/23 0454 10/21/23 1121 10/21/23 2014 10/22/23 0526  NA 134* 134*  --  137  K 3.7 3.4*  --  3.5  CL 96* 97*  --  102  CO2 22 25  --  26  GLUCOSE 214* 204*  --  125*  BUN 21* 32*  --  32*  CREATININE 1.20 1.21 1.04 1.14  CALCIUM 9.6 8.6*  --  8.4*  MG  --   --  2.4  --    Liver Function Tests: Recent Labs  Lab 10/19/23 0454 10/21/23 1121  AST 42* 28  ALT 32 23  ALKPHOS 103 86  BILITOT 2.6* 2.9*  PROT 8.4* 7.6  ALBUMIN 4.5 4.0   CBG: Recent Labs  Lab 10/19/23 1112 10/21/23 1118 10/21/23 1750 10/21/23 2057 10/22/23 0812  GLUCAP 189* 190* 133* 148* 114*    Discharge time spent: less than 30 minutes.  Signed: Deanna Artis, DO [facility] 10/22/2023

## 2023-10-23 LAB — ERYTHROPOIETIN: Erythropoietin: 4.9 m[IU]/mL (ref 2.6–18.5)

## 2023-10-23 LAB — HAPTOGLOBIN: Haptoglobin: 80 mg/dL (ref 29–370)

## 2023-10-26 LAB — CULTURE, BLOOD (ROUTINE X 2)
Culture: NO GROWTH
Culture: NO GROWTH
Special Requests: ADEQUATE

## 2023-10-27 LAB — HEPATIC FUNCTION PANEL
ALT: 23 U/L (ref 10–40)
ALT: 23 U/L (ref 10–40)
AST: 23 (ref 14–40)
AST: 23 (ref 14–40)
Alkaline Phosphatase: 88 (ref 25–125)
Alkaline Phosphatase: 88 (ref 25–125)
Bilirubin, Total: 1.8
Bilirubin, Total: 1.8

## 2023-10-27 LAB — BASIC METABOLIC PANEL WITH GFR
BUN: 19 (ref 4–21)
BUN: 22 — AB (ref 4–21)
BUN: 22 — AB (ref 4–21)
CO2: 29 — AB (ref 13–22)
CO2: 29 — AB (ref 13–22)
CO2: 29 — AB (ref 13–22)
Chloride: 95 — AB (ref 99–108)
Chloride: 95 — AB (ref 99–108)
Chloride: 95 — AB (ref 99–108)
Creatinine: 1.2 (ref 0.6–1.3)
Creatinine: 1.2 (ref 0.6–1.3)
Glucose: 143
Potassium: 3.9 meq/L (ref 3.5–5.1)
Potassium: 3.9 meq/L (ref 3.5–5.1)
Potassium: 3.9 meq/L (ref 3.5–5.1)
Sodium: 136 — AB (ref 137–147)
Sodium: 136 — AB (ref 137–147)
Sodium: 136 — AB (ref 137–147)

## 2023-10-27 LAB — CBC
RBC: 5.69 — AB (ref 3.87–5.11)
RBC: 5.69 — AB (ref 3.87–5.11)

## 2023-10-27 LAB — CBC AND DIFFERENTIAL
HCT: 52 (ref 41–53)
HCT: 52 (ref 41–53)
Hemoglobin: 16.7 (ref 13.5–17.5)
Hemoglobin: 16.7 (ref 13.5–17.5)
Platelets: 401 10*3/uL — AB (ref 150–400)
Platelets: 401 K/uL — AB (ref 150–400)
WBC: 10.8
WBC: 10.8

## 2023-10-27 LAB — COMPREHENSIVE METABOLIC PANEL WITH GFR
Albumin: 4.2 (ref 3.5–5.0)
Calcium: 9.3 (ref 8.7–10.7)
Calcium: 9.3 (ref 8.7–10.7)
Calcium: 9.3 (ref 8.7–10.7)
eGFR: 73

## 2023-11-03 DIAGNOSIS — E1140 Type 2 diabetes mellitus with diabetic neuropathy, unspecified: Secondary | ICD-10-CM | POA: Diagnosis not present

## 2023-11-03 DIAGNOSIS — Z794 Long term (current) use of insulin: Secondary | ICD-10-CM | POA: Diagnosis not present

## 2023-11-03 DIAGNOSIS — Z13220 Encounter for screening for lipoid disorders: Secondary | ICD-10-CM | POA: Diagnosis not present

## 2023-11-03 LAB — HEMOGLOBIN A1C: Hemoglobin A1C: 6.4

## 2023-11-03 LAB — LIPID PANEL
Cholesterol: 184 (ref 0–200)
HDL: 47 (ref 35–70)
LDL Cholesterol: 115
Triglycerides: 112 (ref 40–160)

## 2023-11-03 LAB — TSH: TSH: 0.13 — AB (ref 0.41–5.90)

## 2023-11-09 DIAGNOSIS — B353 Tinea pedis: Secondary | ICD-10-CM | POA: Diagnosis not present

## 2023-11-23 DIAGNOSIS — Z0000 Encounter for general adult medical examination without abnormal findings: Secondary | ICD-10-CM | POA: Diagnosis not present

## 2023-11-23 DIAGNOSIS — E1140 Type 2 diabetes mellitus with diabetic neuropathy, unspecified: Secondary | ICD-10-CM | POA: Diagnosis not present

## 2023-11-23 DIAGNOSIS — Z125 Encounter for screening for malignant neoplasm of prostate: Secondary | ICD-10-CM | POA: Diagnosis not present

## 2023-11-23 DIAGNOSIS — Z794 Long term (current) use of insulin: Secondary | ICD-10-CM | POA: Diagnosis not present

## 2024-01-02 DIAGNOSIS — E1142 Type 2 diabetes mellitus with diabetic polyneuropathy: Secondary | ICD-10-CM | POA: Diagnosis not present

## 2024-01-02 DIAGNOSIS — M2012 Hallux valgus (acquired), left foot: Secondary | ICD-10-CM | POA: Diagnosis not present

## 2024-01-02 DIAGNOSIS — B353 Tinea pedis: Secondary | ICD-10-CM | POA: Diagnosis not present

## 2024-01-26 ENCOUNTER — Ambulatory Visit (INDEPENDENT_AMBULATORY_CARE_PROVIDER_SITE_OTHER): Admitting: Family Medicine

## 2024-01-26 VITALS — BP 132/73 | HR 59 | Wt 202.4 lb

## 2024-01-26 DIAGNOSIS — Z114 Encounter for screening for human immunodeficiency virus [HIV]: Secondary | ICD-10-CM | POA: Diagnosis not present

## 2024-01-26 DIAGNOSIS — R159 Full incontinence of feces: Secondary | ICD-10-CM | POA: Diagnosis not present

## 2024-01-26 DIAGNOSIS — Z1159 Encounter for screening for other viral diseases: Secondary | ICD-10-CM

## 2024-01-26 DIAGNOSIS — R197 Diarrhea, unspecified: Secondary | ICD-10-CM | POA: Diagnosis not present

## 2024-01-26 DIAGNOSIS — Z794 Long term (current) use of insulin: Secondary | ICD-10-CM

## 2024-01-26 DIAGNOSIS — R61 Generalized hyperhidrosis: Secondary | ICD-10-CM | POA: Diagnosis not present

## 2024-01-26 DIAGNOSIS — E1142 Type 2 diabetes mellitus with diabetic polyneuropathy: Secondary | ICD-10-CM | POA: Diagnosis not present

## 2024-01-26 DIAGNOSIS — E559 Vitamin D deficiency, unspecified: Secondary | ICD-10-CM

## 2024-01-26 DIAGNOSIS — Z7689 Persons encountering health services in other specified circumstances: Secondary | ICD-10-CM

## 2024-01-26 NOTE — Progress Notes (Signed)
 New patient visit   Patient: [name] [name]   DOB: Sep 02, 1970   53 y.o. Male  MRN: [id number] Visit Date: 01/26/2024  Today's healthcare provider: Carlean Charter, DO   Chief Complaint  Patient presents with   Establish Care    Previous Dr [name] [name] in [facility] exam done recently.  1. Bowel Movement (lactose intolerance) has been having an accidents (approx. two months) 2. Sweats at night and after eating ongoing years    Subjective    [name] [name] is a 53 y.o. male who presents today as a new patient to establish care.   HPI HPI     Establish Care    Additional comments: Previous Dr [name] [name] in [facility] exam done recently.  1. Bowel Movement (lactose intolerance) has been having an accidents (approx. two months) 2. Sweats at night and after eating ongoing years       Last edited by [name] Charter, DO on 01/26/2024  9:00 AM.      [name] [name] is a 53 year old male who presents with gastrointestinal symptoms and night sweats. He is accompanied by his girlfriend, [name] [name].  He experiences gastrointestinal distress, including loose stools, particularly after consuming cheese, such as on pizza or sandwiches. Symptoms occur without warning, even during sleep, and have been present for about two months. He has tried lactase pills with varying success. Certain types of pizza and mozzarella cheese seem to trigger symptoms more than others, but there is inconsistency in the reaction to different dairy products, including no response to ice cream. No issues with bread consumption.  He has been experiencing night sweats for several months, which have become more frequent and severe over the past year. He describes sweating profusely at night to the point of soaking his bed. This has been ongoing for years, but the frequency and intensity have increased recently. No recent travel outside the country and no other symptoms suggestive of tuberculosis, such  as cough or hemoptysis.  He has a history of gastrointestinal issues related to Ozempic use, which he stopped about a month and a half ago. While on Ozempic, he experienced severe nausea and vomiting, which resolved after discontinuation. He now manages his blood sugar with NovoLog  and uses a Dexcom meter for monitoring. His last HbA1c was 5.7%, indicating well-controlled blood sugars.  He sees his endocrinologist once per year.  He reports neuropathy in his feet, which he manages with compression socks rather than medication, as he found the socks provided similar relief. He experiences numbness and tingling in his feet due to neuropathy. He also mentions using cannabis as advised by a previous doctor.  He has a history of low vitamin D  levels but is not currently taking supplements. He has not experienced any recent weight loss or coughing.    Past Medical History:  Diagnosis Date   Diabetes mellitus type II, uncontrolled    diagnosed age 54yo; week long hospitalization at diagnosis   Hyperlipidemia    Hypertension    Insomnia    Microalbuminuria    Migraine    Neuropathy    bialteral feet   Unspecified disorder of skin and subcutaneous tissue    recurrent summertime rash, large lesions scattered?   Wears glasses    Past Surgical History:  Procedure Laterality Date   VASECTOMY     Family Status  Relation Name Status   Mat Aunt  (Not Specified)   Mat Uncle  (Not Specified)  MGM  (Not Specified)   PGM  (Not Specified)   Neg Hx  (Not Specified)  No partnership data on file   Family History  Problem Relation Age of Onset   Diabetes Maternal Aunt    Diabetes Maternal Uncle    Diabetes Maternal Grandmother    Diabetes Paternal Grandmother    Heart disease Neg Hx    Stroke Neg Hx    Cancer Neg Hx    Social History   Socioeconomic History   Marital status: Divorced    Spouse name: Not on file   Number of children: 3   Years of education: college   Highest education  level: Some college, no degree  Occupational History   Occupation: cellular cells    Employer: Production assistant, radio  Tobacco Use   Smoking status: Never   Smokeless tobacco: Not on file  Substance and Sexual Activity   Alcohol use: Yes    Alcohol/week: 0.0 standard drinks of alcohol    Comment: occasionally   Drug use: No   Sexual activity: Not on file  Other Topics Concern   Not on file  Social History Narrative   Divorced, 2 children 14yo, 16yo, lives with brother and daughter, is an Event organiser for [employer]   Social Drivers of Health   Financial Resource Strain: Low Risk  (01/26/2024)   Overall Financial Resource Strain (CARDIA)    Difficulty of Paying Living Expenses: Not hard at all  Food Insecurity: No Food Insecurity (01/26/2024)   Hunger Vital Sign    Worried About Running Out of Food in the Last Year: Never true    Ran Out of Food in the Last Year: Never true  Transportation Needs: No Transportation Needs (01/26/2024)   PRAPARE - Administrator, Civil Service (Medical): No    Lack of Transportation (Non-Medical): No  Physical Activity: Insufficiently Active (01/26/2024)   Exercise Vital Sign    Days of Exercise per Week: 1 day    Minutes of Exercise per Session: 10 min  Stress: No Stress Concern Present (01/26/2024)   [employer] of Occupational Health - Occupational Stress Questionnaire    Feeling of Stress: Only a little  Social Connections: Moderately Integrated (01/26/2024)   Social Connection and Isolation Panel    Frequency of Communication with Friends and Family: More than three times a week    Frequency of Social Gatherings with Friends and Family: More than three times a week    Attends Religious Services: 1 to 4 times per year    Active Member of [employer] or Organizations: Yes    Attends Banker Meetings: 1 to 4 times per year    Marital Status: Divorced   Outpatient Medications Prior to Visit  Medication Sig   atorvastatin   (LIPITOR) 10 MG tablet Take 1 tablet by mouth at bedtime.   Continuous Glucose Sensor (DEXCOM G7 SENSOR) MISC 1 each by Does not apply route as directed. Every 10 days   FARXIGA 5 MG TABS tablet Take 1 tablet by mouth daily.   Insulin  Aspart FlexPen (NOVOLOG ) 100 UNIT/ML Inject 6 Units into the skin daily as needed (high blood sugar).   lisinopril  (ZESTRIL ) 2.5 MG tablet Take 1 tablet by mouth daily.   [DISCONTINUED] metoCLOPramide  (REGLAN ) 10 MG tablet Take 1 tablet (10 mg total) by mouth every 8 (eight) hours as needed for nausea or vomiting.   [DISCONTINUED] ondansetron  (ZOFRAN -ODT) 4 MG disintegrating tablet Take 1 tablet (4 mg total) by mouth every 8 (  eight) hours as needed for nausea or vomiting.   No facility-administered medications prior to visit.   Allergies  Allergen Reactions   Metformin  Nausea And Vomiting and Nausea Only    Other reaction(s): Constipation (disorder), Vomiting (disorder), Nausea (finding), Vomiting   Ozempic (0.25 Or 0.5 Mg-Dose) [Semaglutide(0.25 Or 0.5mg -Dos)] Nausea And Vomiting    Stomach cramps     Immunization History  Administered Date(s) Administered   Hep A / Hep B 10/25/2022   Influenza, Quadrivalent, Recombinant, Inj, Pf 07/04/2019   Influenza,inj,Quad PF,6+ Mos 07/30/2015, 05/05/2016, 06/06/2017, 08/01/2018, 05/17/2019, 06/17/2020, 06/18/2021, 04/26/2022   PFIZER Comirnaty(Gray Top)Covid-19 Tri-Sucrose Vaccine 01/07/2021   PFIZER(Purple Top)SARS-COV-2 Vaccination 10/24/2019, 11/14/2019, 06/20/2020   PNEUMOCOCCAL CONJUGATE-20 04/26/2022   Pfizer Covid-19 Vaccine Bivalent Booster 22yrs & up 07/01/2021   Pneumococcal Polysaccharide-23 07/05/2012, 07/04/2019, 06/17/2020   Tdap 07/05/2012, 12/05/2018   Zoster Recombinant(Shingrix) 06/18/2021, 10/14/2021    Health Maintenance  Topic Date Due   FOOT EXAM  Never done   OPHTHALMOLOGY EXAM  Never done   Hepatitis C Screening  Never done   Diabetic kidney evaluation - Urine ACR  07/05/2013    Colonoscopy  Never done   COVID-19 Vaccine (6 - 2024-25 season) 04/17/2023   INFLUENZA VACCINE  03/16/2024   HEMOGLOBIN A1C  05/05/2024   Diabetic kidney evaluation - eGFR measurement  10/26/2024   DTaP/Tdap/Td (3 - Td or Tdap) 12/04/2028   Pneumococcal Vaccine 75-23 Years old  Completed   HIV Screening  Completed   Zoster Vaccines- Shingrix  Completed   HPV VACCINES  Aged Out   Meningococcal B Vaccine  Aged Out    Patient Care Team: Madelynn Malson N, DO as PCP - General (Family Medicine)       Objective    BP 132/73 (BP Location: Left Arm, Patient Position: Sitting, Cuff Size: Normal)   Pulse (!) 59   Wt 202 lb 6.4 oz (91.8 kg)   SpO2 100%   BMI 29.04 kg/m     Physical Exam Vitals reviewed.  Constitutional:      General: He is not in acute distress.    Appearance: Normal appearance. He is not diaphoretic.  HENT:     Head: Normocephalic and atraumatic.   Eyes:     General: No scleral icterus.    Conjunctiva/sclera: Conjunctivae normal.    Cardiovascular:     Rate and Rhythm: Normal rate and regular rhythm.     Pulses: Normal pulses.     Heart sounds: Normal heart sounds. No murmur heard. Pulmonary:     Effort: Pulmonary effort is normal. No respiratory distress.     Breath sounds: Normal breath sounds. No wheezing or rhonchi.   Musculoskeletal:     Cervical back: Neck supple.     Right lower leg: No edema.     Left lower leg: No edema.  Lymphadenopathy:     Cervical: No cervical adenopathy.   Skin:    General: Skin is warm and dry.     Findings: No rash.   Neurological:     Mental Status: He is alert and oriented to person, place, and time. Mental status is at baseline.   Psychiatric:        Mood and Affect: Mood normal.        Behavior: Behavior normal.     Depression Screen    01/26/2024   10:03 AM 01/26/2024    8:49 AM  PHQ 2/9 Scores  PHQ - 2 Score 0 0  PHQ- 9 Score 2  Assessment & Plan     Night sweats -     CBC with  Differential/Platelet -     Comprehensive metabolic panel with GFR -     QuantiFERON-TB Gold Plus -     TSH Rfx on Abnormal to Free T4 -     HIV Antibody (routine testing w rflx) -     HCV Ab w Reflex to Quant PCR  Establishing care with new doctor, encounter for  Incontinence of feces, unspecified fecal incontinence type  Diarrhea, unspecified type -     Lactose tolerance test -     Comprehensive metabolic panel with GFR -     TSH Rfx on Abnormal to Free T4  Controlled type 2 diabetes mellitus with diabetic polyneuropathy, with long-term current use of insulin  (HCC) -     Comprehensive metabolic panel with GFR -     Microalbumin / creatinine urine ratio  Bilirubinemia -     Bilirubin, fractionated(tot/dir/indir) -     Haptoglobin  Vitamin D  deficiency -     VITAMIN D  25 Hydroxy (Vit-D Deficiency, Fractures)  Encounter for screening for HIV -     HIV Antibody (routine testing w rflx)  Encounter for hepatitis C screening test for low risk patient -     HCV Ab w Reflex to Quant PCR    Establishing care with a new doctor, encounter for  Night Sweats Severe night sweats for a year. Differential includes infectious disease, idiopathic hyperhidrosis, or hormonal imbalances. No weight loss or productive cough, reducing TB likelihood. - Order QuantiFERON gold TB test, HIV screening and hepatitis C screening. - Check complete blood count.  Intermittent fecal incontinence; diarrhea Intermittent diarrhea and loose stools with intermittent fecal incontinence, suspected to be after dairy consumption. Lactase pills tried with inconsistent results. Differential includes lactose intolerance, milk protein allergy, or IBS. - Maintain food diary to track intake and symptoms. - Take lactase pills before dairy and monitor response.  Diabetes Mellitus Managed with NovoLog  due to GI side effects from Ozempic. Blood sugars monitored with Dexcom. Last HbA1c 6.4%. - Continue NovoLog . -  Continue monitoring blood sugars with Dexcom. - Follows with endocrinology; defer to specialist management  Peripheral Neuropathy Chronic neuropathy with numbness and tingling. Previously tried medication without relief. Managed with compression socks and cannabis. - Continue use of compression socks.  Vitamin D  Deficiency Low vitamin D  levels. Previously on high-dose vitamin D , not currently supplemented. - Order vitamin D  level. - Consider reinitiating supplementation based on results.  Bilirubinemia Previously noted and partially evaluated by gastroenterology.  Patient does not appear to have had any haptoglobin level recommended drawn; will order today, along with fractionated   Return in about 4 weeks (around 02/23/2024) for Chronic f/u.     I discussed the assessment and treatment plan with the patient  The patient was provided an opportunity to ask questions and all were answered. The patient agreed with the plan and demonstrated an understanding of the instructions.   The patient was advised to call back or seek an in-person evaluation if the symptoms worsen or if the condition fails to improve as anticipated.    [name] Charter, DO  [facility] [phone] (phone) [phone] (fax)  [facility]

## 2024-01-27 LAB — CBC WITH DIFFERENTIAL/PLATELET

## 2024-01-31 LAB — LACTOSE TOLERANCE TEST
Glucose, 1 hour: 109 mg/dL
Glucose: 100 mg/dL
Glucose: 103 mg/dL
Glucose: 111 mg/dL
Glucose: 111 mg/dL

## 2024-01-31 LAB — CBC WITH DIFFERENTIAL/PLATELET
Basos: 1 %
EOS (ABSOLUTE): 0 10*3/uL (ref 0.0–0.2)
Eos: 2 %
Hematocrit: 50.9 % (ref 37.5–51.0)
Hemoglobin: 15.9 g/dL (ref 13.0–17.7)
Immature Granulocytes: 0 %
Immature Granulocytes: 0 10*3/uL (ref 0.0–0.1)
Lymphs: 31 %
MCH: 29.1 pg (ref 26.6–33.0)
MCHC: 31.2 g/dL — ABNORMAL LOW (ref 31.5–35.7)
MCV: 93 fL (ref 79–97)
Monocytes Absolute: 0.1 10*3/uL (ref 0.0–0.4)
Monocytes Absolute: 0.4 10*3/uL (ref 0.1–0.9)
Monocytes: 7 %
Neutrophils Absolute: 1.8 10*3/uL (ref 0.7–3.1)
Neutrophils Absolute: 3.5 10*3/uL (ref 1.4–7.0)
Neutrophils: 59 %
Platelets: 301 10*3/uL (ref 150–450)
RBC: 5.46 x10E6/uL (ref 4.14–5.80)
RDW: 12.5 % (ref 11.6–15.4)
WBC: 5.9 10*3/uL (ref 3.4–10.8)

## 2024-01-31 LAB — BILIRUBIN, FRACTIONATED(TOT/DIR/INDIR)
Bilirubin, Direct: 0.25 mg/dL (ref 0.00–0.40)
Bilirubin, Indirect: 0.45 mg/dL (ref 0.10–0.80)

## 2024-01-31 LAB — QUANTIFERON-TB GOLD PLUS
QuantiFERON Nil Value: 0.04 [IU]/mL
QuantiFERON TB1 Ag Value: 0.05 [IU]/mL
QuantiFERON TB2 Ag Value: 0.08 [IU]/mL

## 2024-01-31 LAB — COMPREHENSIVE METABOLIC PANEL WITH GFR
ALT: 24 IU/L (ref 0–44)
AST: 32 IU/L (ref 0–40)
Albumin: 4.5 g/dL (ref 3.8–4.9)
Alkaline Phosphatase: 111 IU/L (ref 44–121)
BUN/Creatinine Ratio: 13 (ref 9–20)
BUN: 18 mg/dL (ref 6–24)
Bilirubin Total: 0.7 mg/dL (ref 0.0–1.2)
CO2: 21 mmol/L (ref 20–29)
Calcium: 9.8 mg/dL (ref 8.7–10.2)
Chloride: 105 mmol/L (ref 96–106)
Creatinine, Ser: 1.39 mg/dL — ABNORMAL HIGH (ref 0.76–1.27)
Globulin, Total: 2.9 g/dL (ref 1.5–4.5)
Glucose: 109 mg/dL — ABNORMAL HIGH (ref 70–99)
Potassium: 4.6 mmol/L (ref 3.5–5.2)
Sodium: 144 mmol/L (ref 134–144)
Total Protein: 7.4 g/dL (ref 6.0–8.5)
eGFR: 61 mL/min/{1.73_m2} (ref >59)

## 2024-01-31 LAB — MICROALBUMIN / CREATININE URINE RATIO
Creatinine, Urine: 95.8 mg/dL
Microalb/Creat Ratio: 34 mg/g{creat} — ABNORMAL HIGH (ref 0–29)
Microalbumin, Urine: 32.4 ug/mL

## 2024-01-31 LAB — TSH RFX ON ABNORMAL TO FREE T4: TSH: 0.301 u[IU]/mL — ABNORMAL LOW (ref 0.450–4.500)

## 2024-01-31 LAB — HIV ANTIBODY (ROUTINE TESTING W REFLEX): HIV Screen 4th Generation wRfx: NONREACTIVE

## 2024-01-31 LAB — HCV AB W REFLEX TO QUANT PCR: HCV Ab: NONREACTIVE

## 2024-01-31 LAB — HCV INTERPRETATION

## 2024-01-31 LAB — VITAMIN D 25 HYDROXY (VIT D DEFICIENCY, FRACTURES): Vit D, 25-Hydroxy: 27.3 ng/mL — ABNORMAL LOW (ref 30.0–100.0)

## 2024-01-31 LAB — HAPTOGLOBIN: Haptoglobin: 53 mg/dL (ref 29–370)

## 2024-01-31 LAB — T4F: T4,Free (Direct): 1.3 ng/dL (ref 0.82–1.77)

## 2024-02-06 ENCOUNTER — Ambulatory Visit: Payer: Self-pay | Admitting: Family Medicine

## 2024-02-06 DIAGNOSIS — E049 Nontoxic goiter, unspecified: Secondary | ICD-10-CM

## 2024-02-06 DIAGNOSIS — E0590 Thyrotoxicosis, unspecified without thyrotoxic crisis or storm: Secondary | ICD-10-CM | POA: Insufficient documentation

## 2024-02-06 MED ORDER — METHIMAZOLE 5 MG PO TABS: 5.0000 mg | ORAL_TABLET | Freq: Three times a day (TID) | ORAL | 0 refills | Status: DC

## 2024-02-14 ENCOUNTER — Ambulatory Visit

## 2024-02-16 ENCOUNTER — Ambulatory Visit: Admitting: Physician Assistant

## 2024-02-16 ENCOUNTER — Ambulatory Visit: Payer: Self-pay

## 2024-02-16 DIAGNOSIS — M778 Other enthesopathies, not elsewhere classified: Secondary | ICD-10-CM | POA: Diagnosis not present

## 2024-02-16 NOTE — Telephone Encounter (Signed)
 FYI Only or Action Required?: FYI only for provider.  Patient was last seen in primary care on 01/26/2024 by Donzella Lauraine SAILOR, DO. Called Nurse Triage reporting Joint Swelling. Symptoms began several days ago. Interventions attempted: OTC medications: minimal relief. Symptoms are: unchanged.  Triage Disposition: See Physician Within 24 Hours  Patient/caregiver understands and will follow disposition?: Yes      Copied from CRM [id number]. Topic: Clinical - Red Word Triage >> Feb 16, 2024  8:09 AM [name] [name] wrote: Red Word that prompted transfer to Nurse Triage: swelling and pian in his elbow Reason for Disposition  [1] Painful joint AND [2] no fever  Answer Assessment - Initial Assessment Questions 1. LOCATION: Where is the swelling? (e.g., left, right, both elbows)     R elbow 2. SIZE and DESCRIPTION: What does the swelling look like? (e.g., entire elbow, localized)     Just around the joint - concerns for dislocation 3. ONSET: When did the swelling start? Does it come and go, or is it there all the time?     2-3 days 4. SETTING: Has there been any recent work, exercise or other activity that involved that part of the body?      Reports job is physical - but does not recall anything happening 5. AGGRAVATING FACTORS: What makes the elbow swelling worse? (e.g., work, sports activities)     movement 6. ASSOCIATED SYMPTOMS: Is there any pain or redness?     Redness and pain 5/10 - reports taking OTC meds with minimal relief  7. OTHER SYMPTOMS: Do you have any other symptoms? (e.g., fever)     denies 8. PREGNANCY: Is there any chance you are pregnant? When was your last menstrual period?     N/a  Protocols used: Elbow Swelling-A-AH

## 2024-02-22 ENCOUNTER — Ambulatory Visit: Payer: Self-pay

## 2024-02-22 ENCOUNTER — Ambulatory Visit
Admission: EM | Admit: 2024-02-22 | Discharge: 2024-02-22 | Disposition: A | Discharge status: Home / Self Care | Attending: Emergency Medicine | Admitting: Emergency Medicine

## 2024-02-22 ENCOUNTER — Encounter: Payer: Self-pay | Admitting: Emergency Medicine

## 2024-02-22 DIAGNOSIS — H938X1 Other specified disorders of right ear: Secondary | ICD-10-CM

## 2024-02-22 MED ORDER — IPRATROPIUM BROMIDE 0.03 % NA SOLN
2.0000 | Freq: Two times a day (BID) | NASAL | 0 refills | Status: DC
Start: 2024-02-22 — End: 2024-02-27

## 2024-02-22 MED ORDER — CIPROFLOXACIN-DEXAMETHASONE 0.3-0.1 % OT SUSP: 4.0000 [drp] | Freq: Two times a day (BID) | OTIC | 0 refills | Status: DC

## 2024-02-22 NOTE — Discharge Instructions (Signed)
 Today you are evaluated for your ear fullness, on exam there are no signs of infection therefore symptoms are most likely related to your sinuses  Empirically placed on Ciprodex  which an antibiotic and a steroid to see if this will help to alleviate symptoms, placed 4 drops into the ear every morning and every evening for 7 days  Used ipratropium nasal spray every morning and every evening to help to clear out the sinus passages  Purchase over-the-counter Mucinex or pseudoephedrine to further help to reduce any congestion within the sinuses  May take Tylenol  as needed for any pain that occurs  If your symptoms continue to persist you may follow back up with urgent care, your primary doctor or ear nose and throat specialist, information is on from page

## 2024-02-22 NOTE — Telephone Encounter (Signed)
   FYI Only or Action Required?: FYI only for provider.  Patient was last seen in primary care on 01/26/2024 by Donzella Lauraine SAILOR, DO.  Called Nurse Triage reporting Right Ear Problem.  Symptoms began since last night around 6:45 PM.  Interventions attempted: Nothing.  Symptoms are: gradually worsening.  Triage Disposition: See HCP Within 4 Hours (Or PCP Triage)  Patient/caregiver understands and will follow disposition?: Yes--Patient going to Urgent Care due to no availability in PCP office                            Last night around 6:45 PM 1-1.5 year and a half felt like something crawling.      Copied from CRM [id number]. Topic: Clinical - Red Word Triage >> Feb 22, 2024  8:03 AM Everette C wrote: [facility] that prompted transfer to Nurse Triage: The patient is concerned that something may be crawling or have burst in their right ear. The patient shares that they are experiencing pressure and uncomfortable sensations that have recently increased. Reason for Disposition  [1] Hearing loss in one or both ears AND [2] sudden onset AND [3] present now  Answer Assessment - Initial Assessment Questions 1. DESCRIPTION: What type of hearing problem are you having? Describe it for me. (e.g., complete hearing loss, partial loss)     Loss of/decreased hearing  maybe a slight ringing per patient 2. LOCATION: One or both ears? If one, ask: Which ear?     Right 3. SEVERITY: Can you hear anything? If Yes, ask: What can you hear? (e.g., ticking watch, whisper, talking)   - MILD:  Difficulty hearing soft speech, quiet library sounds, or speech from a distance or over background noise.   - MODERATE: Difficulty hearing normal speech even at closed distances.   - SEVERE: Unable to hear most normal conversation and talking; only able to hear loud sounds such as an alarm clock.      Loss of/decreased hearing  maybe a slight ringing per patient 4. ONSET: When did  this begin? Did it start suddenly or come on gradually?     Last night at 6:45 PM 5. PATTERN: Does this come and go, or has it been constant since it started?     constant 6. PAIN: Is there any pain in your ear(s)?  (Scale 1-10; or mild, moderate, severe)   - NONE (0): no pain   - MILD (1-3): doesn't interfere with normal activities    - MODERATE (4-7): interferes with normal activities or awakens from sleep    - SEVERE (8-10): excruciating pain, unable to do any normal activities      No 7. CAUSE: What do you think is causing this hearing problem?     Not sure  he states he thought he has felt something crawling in his ear for the past 1-1.5 years but he thought maybe it was a hair in his ear tickling him.  8. OTHER SYMPTOMS: Do you have any other symptoms? (e.g., dizziness, ringing in ears)     Slight ringing per patient  Patient states he is going to the nearest Urgent Care to be seen after this telephone triage.  Protocols used: Hearing Loss or Change-A-AH

## 2024-02-22 NOTE — ED Provider Notes (Signed)
 CAY RALPH PELT    CSN: [id number] Arrival date & time: 02/22/24  9171      History   Chief Complaint Chief Complaint  Patient presents with   Ear Fullness    HPI [name] [name] is a 53 y.o. male.   Patient presents for evaluation of right sided ear fullness beginning 1 day ago.  Decreased hearing feels as if the ear needs to pop.  Endorses frequent flying.  Has attempted to try to pop the ear but unsuccessful.  Denies fever, congestion, sore throat.  Past Medical History:  Diagnosis Date   Diabetes mellitus type II, uncontrolled    diagnosed age 61yo; week long hospitalization at diagnosis   Hyperlipidemia    Hypertension    Insomnia    Microalbuminuria    Migraine    Neuropathy    bialteral feet   Unspecified disorder of skin and subcutaneous tissue    recurrent summertime rash, large lesions scattered?   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 Authorizing Provider  ciprofloxacin -dexamethasone  (CIPRODEX ) OTIC suspension Place 4 drops into the right ear 2 (two) times daily. 02/22/24  Yes Grizelda Piscopo R, NP  ipratropium (ATROVENT ) 0.03 % nasal spray Place 2 sprays into both nostrils every 12 (twelve) hours. 02/22/24  Yes Oluwadamilola Deliz R, NP  atorvastatin  (LIPITOR) 10 MG tablet Take 1 tablet by mouth at bedtime. 11/23/23   [provider]  Continuous Glucose Sensor (DEXCOM G7  SENSOR) MISC 1 each by Does not apply route as directed. Every 10 days    [provider]  FARXIGA 5 MG TABS tablet Take 1 tablet by mouth daily. 11/03/23   [provider]  Insulin  Aspart FlexPen (NOVOLOG ) 100 UNIT/ML Inject 6 Units into the skin daily as needed (high blood sugar). 07/10/19   [provider]  lisinopril  (ZESTRIL ) 2.5 MG tablet Take 1 tablet by mouth daily. 11/23/23   [provider]  methimazole  (TAPAZOLE ) 5 MG tablet Take 1 tablet (5 mg total) by mouth 3 (three) times daily. 02/06/24   Donzella Lauraine SAILOR, DO    Family History Family History  Problem Relation Age of Onset   Diabetes Maternal Aunt    Diabetes Maternal Uncle    Diabetes Maternal Grandmother    Diabetes Paternal Grandmother    Heart disease Neg Hx    Stroke Neg Hx    Cancer Neg Hx     Social History Social History   Tobacco Use   Smoking status: Never  Substance Use Topics   Alcohol use: Yes    Alcohol/week: 0.0 standard drinks of alcohol    Comment: occasionally   Drug use: No     Allergies   Metformin  and Ozempic (0.25 or 0.5 mg-dose) [semaglutide(0.25 or 0.5mg -dos)]   Review of Systems Review of Systems   Physical Exam Triage Vital Signs ED Triage Vitals  Encounter Vitals Group  BP 02/22/24 0836 (!) 150/81     Girls Systolic BP Percentile --      Girls Diastolic BP Percentile --      Boys Systolic BP Percentile --      Boys Diastolic BP Percentile --      Pulse Rate 02/22/24 0836 60     Resp 02/22/24 0836 18     Temp 02/22/24 0836 98 F (36.7 C)     Temp Source 02/22/24 0836 Oral     SpO2 02/22/24 0836 97 %     Weight --      Height --      Head Circumference --      Peak Flow --      Pain Score 02/22/24 0838 0     Pain Loc --      Pain Education --      Exclude from Growth Chart --    No data found.  Updated Vital Signs BP (!) 150/81 (BP Location: Left Arm) Comment: did not take BP meds last night  Pulse 60   Temp 98 F (36.7 C)  (Oral)   Resp 18   SpO2 97%   Visual Acuity Right Eye Distance:   Left Eye Distance:   Bilateral Distance:    Right Eye Near:   Left Eye Near:    Bilateral Near:     Physical Exam Constitutional:      Appearance: Normal appearance.  HENT:     Right Ear: Tympanic membrane, ear canal and external ear normal.     Left Ear: Tympanic membrane, ear canal and external ear normal.  Eyes:     Extraocular Movements: Extraocular movements intact.  Pulmonary:     Effort: Pulmonary effort is normal.  Neurological:     Mental Status: He is alert.      UC Treatments / Results  Labs (all labs ordered are listed, but only abnormal results are displayed) Labs Reviewed - No data to display  EKG   Radiology No results found.  Procedures Procedures (including critical care time)  Medications Ordered in UC Medications - No data to display  Initial Impression / Assessment and Plan / UC Course  I have reviewed the triage vital signs and the nursing notes.  Pertinent labs & imaging results that were available during my care of the patient were reviewed by me and considered in my medical decision making (see chart for details).  Right ear fullness  No abnormality indicating infection on exam, symptoms most likely related to the sinuses, discussed, currently taking prednisone for management of the right elbow, has taken 4 days, empirically placed on Ciprodex  and prescribed Atrovent  nasal spray, recommended initiating Mucinex or pseudoephedrine for additional support, advise follow-up if no improvement Final Clinical Impressions(s) / UC Diagnoses   Final diagnoses:  Ear fullness, right     Discharge Instructions      Today you are evaluated for your ear fullness, on exam there are no signs of infection therefore symptoms are most likely related to your sinuses  Empirically placed on Ciprodex  which an antibiotic and a steroid to see if this will help to alleviate symptoms, placed  4 drops into the ear every morning and every evening for 7 days  Used ipratropium nasal spray every morning and every evening to help to clear out the sinus passages  Purchase over-the-counter Mucinex or pseudoephedrine to further help to reduce any congestion within the sinuses  May take Tylenol  as needed for any pain that  occurs  If your symptoms continue to persist you may follow back up with urgent care, your primary doctor or ear nose and throat specialist, information is on from page   ED Prescriptions     Medication Sig Dispense Auth. Provider   ciprofloxacin -dexamethasone  (CIPRODEX ) OTIC suspension Place 4 drops into the right ear 2 (two) times daily. 7.5 mL Jodye Scali R, NP   ipratropium (ATROVENT ) 0.03 % nasal spray Place 2 sprays into both nostrils every 12 (twelve) hours. 30 mL Teresa Shelba SAUNDERS, NP      PDMP not reviewed this encounter.   Teresa Shelba SAUNDERS, [place] 02/22/24 [phone]

## 2024-02-22 NOTE — ED Triage Notes (Signed)
 Patient reports that he feels like his  right ear is clogged up. Reported  that it started at 6:30 pm yesterday. Denies pain.

## 2024-02-27 ENCOUNTER — Ambulatory Visit (INDEPENDENT_AMBULATORY_CARE_PROVIDER_SITE_OTHER): Admitting: Family Medicine

## 2024-02-27 ENCOUNTER — Encounter: Payer: Self-pay | Admitting: Family Medicine

## 2024-02-27 VITALS — BP 122/74 | HR 75 | Temp 98.5°F | Ht 70.0 in | Wt 196.2 lb

## 2024-02-27 DIAGNOSIS — H938X1 Other specified disorders of right ear: Secondary | ICD-10-CM

## 2024-02-27 DIAGNOSIS — H65191 Other acute nonsuppurative otitis media, right ear: Secondary | ICD-10-CM

## 2024-02-27 DIAGNOSIS — H6991 Unspecified Eustachian tube disorder, right ear: Secondary | ICD-10-CM | POA: Diagnosis not present

## 2024-02-27 MED ORDER — AMOXICILLIN-POT CLAVULANATE 875-125 MG PO TABS
1.0000 | ORAL_TABLET | Freq: Two times a day (BID) | ORAL | 0 refills | Status: AC
Start: 2024-02-27 — End: ?

## 2024-02-27 MED ORDER — FLUTICASONE PROPIONATE 50 MCG/ACT NA SUSP: 2.0000 | Freq: Every day | NASAL | 6 refills | Status: DC

## 2024-02-27 NOTE — Patient Instructions (Addendum)
 Stop current medications (ear drop and nose spray)  START: Augmentin   May buy over the counter nasal saline spray - spray each nostril twice daily, wait 15 minute then spray each nostril with Flonase   May also take a daily antihistamine (Zyrtec, Allegra or Claritin if allergy related)

## 2024-02-27 NOTE — Progress Notes (Signed)
 Acute Office Visit  Introduced to nurse practitioner role and practice setting.  All questions answered.  Discussed provider/patient relationship and expectations.   Subjective:     Patient ID: [name] [name], male    DOB: Jun 09, 1971, 53 y.o.   MRN: [id number]  Chief Complaint  Patient presents with   Hearing Loss    Right ear, onset 7/8. Feels clogged, can hear a little but not a lot. Seen at [facility] on 7/9 and ear drops and medication not helping.     Discussed the use of AI scribe software for clinical note transcription with the patient, who gave verbal consent to proceed.  History of Present Illness [name] Ilija Maxim is a 53 year old male who presents with right ear fullness.  He experienced a sudden onset of right ear fullness at 6:45 PM while in his car, describing it as if 'somebody flipped a switch to turn my ear off.' The sensation is likened to being on an airplane, with the ear feeling 'popped' and 'stopped up,' and the sound is 'really, really decreased.' Despite attempts to alleviate the sensation by blowing, chewing gum, and other methods, the fullness persisted into the next morning.  He visited urgent care, where he was told that nothing appeared to be wrong with his ear. He was prescribed ear drops (Ciprodex ) and ipratropium nasal spray, but the drops have not alleviated his symptoms, on 02/22/24. He has no history of sinus issues and has not experienced any pain, dizziness, eye pain, or nose pain. He denies any recent infections, fevers, or difficulty breathing.  He can hear out of the affected ear, but it feels as though something is obstructing it. He describes a sensation of wanting to remove a headset from his ear. The fullness is localized to the right ear, with no symptoms on the left side.  No known allergies to antibiotics.    HPI Review of Systems  Constitutional:  Negative for chills, diaphoresis, fever, malaise/fatigue and weight loss.  HENT:   Negative for ear discharge, sinus pain and sore throat.   Eyes:  Negative for blurred vision, double vision, photophobia, pain, discharge and redness.  Respiratory:  Negative for cough, hemoptysis, sputum production, shortness of breath, wheezing and stridor.   Cardiovascular:  Negative for chest pain and palpitations.  Gastrointestinal:  Negative for abdominal pain, constipation, diarrhea, heartburn, nausea and vomiting.  Genitourinary:  Negative for dysuria.  Musculoskeletal:  Negative for back pain, joint pain and neck pain.  Skin:  Negative for rash.  Neurological:  Negative for dizziness.        Objective:    BP 122/74 (BP Location: Left Arm, Patient Position: Sitting, Cuff Size: Normal) Comment: manual  Pulse 75   Temp 98.5 F (36.9 C) (Oral)   Ht 5' 10 (1.778 m)   Wt 196 lb 3.2 oz (89 kg)   SpO2 99%   BMI 28.15 kg/m    Physical Exam Constitutional:      General: He is not in acute distress.    Appearance: Normal appearance. He is not ill-appearing, toxic-appearing or diaphoretic.  HENT:     Head: Normocephalic.     Salivary Glands: Right salivary gland is not diffusely enlarged or tender. Left salivary gland is not diffusely enlarged or tender.     Right Ear: Ear canal and external ear normal. No drainage or swelling. A middle ear effusion is present. There is no impacted cerumen. No foreign body. Tympanic membrane is injected. Tympanic membrane is  not perforated, erythematous or bulging.     Left Ear: Tympanic membrane, ear canal and external ear normal. There is no impacted cerumen.     Ears:     Comments: Muffled hearing per patient, but can hear    Nose: Nose normal.     Mouth/Throat:     Mouth: Mucous membranes are moist.  Eyes:     Extraocular Movements: Extraocular movements intact.     Conjunctiva/sclera: Conjunctivae normal.     Pupils: Pupils are equal, round, and reactive to light.  Cardiovascular:     Rate and Rhythm: Normal rate and regular rhythm.      Heart sounds: No murmur heard.    No friction rub. No gallop.  Pulmonary:     Effort: Pulmonary effort is normal. No respiratory distress.     Breath sounds: Normal breath sounds. No stridor. No wheezing, rhonchi or rales.  Chest:     Chest wall: No tenderness.  Musculoskeletal:     Right lower leg: No edema.     Left lower leg: No edema.  Skin:    General: Skin is warm and dry.     Capillary Refill: Capillary refill takes less than 2 seconds.  Neurological:     General: No focal deficit present.     Mental Status: He is alert and oriented to person, place, and time. Mental status is at baseline.     Cranial Nerves: No cranial nerve deficit.     Sensory: No sensory deficit.     Motor: No weakness.     Coordination: Coordination normal.     Gait: Gait normal.  Psychiatric:        Attention and Perception: Attention and perception normal.        Mood and Affect: Affect is flat.        Speech: Speech normal.        Behavior: Behavior is withdrawn. Behavior is cooperative.        Thought Content: Thought content normal.        Cognition and Memory: Cognition and memory normal.        Judgment: Judgment normal.     No results found for any visits on 02/27/24.      Assessment & Plan:  Assessment and Plan Assessment & Plan Right ear fullness Sudden onset fullness and muffled hearing in right ear.  - mucoid Fluid behind TM  - Eustachian tube disorder vs acute otitis media, possibly related to sinus issues. - Denies dizziness, vision changes, ringing in ear, fevers - Discontinue ear drops - no improved, been on for 6 days - Prescribe po abx; augmentin  for 7 days. - Prescribe Flonase  nasal spray after nasal saline spray. - Recommend nasal saline spray in each nostril - If symptom persists or worsen f/u with PCP - Consider ENT referral if symptoms persist.   Problem List Items Addressed This Visit   None Visit Diagnoses       Acute effusion of right ear    -  Primary    Relevant Medications   amoxicillin -clavulanate (AUGMENTIN ) 875-125 MG tablet     Ear fullness, right       Relevant Medications   fluticasone  (FLONASE ) 50 MCG/ACT nasal spray     Eustachian tube disorder, right       Relevant Medications   fluticasone  (FLONASE ) 50 MCG/ACT nasal spray     Acute mucoid otitis media of right ear       Relevant Medications   amoxicillin -clavulanate (  AUGMENTIN ) 875-125 MG tablet       Meds ordered this encounter  Medications   amoxicillin -clavulanate (AUGMENTIN ) 875-125 MG tablet    Sig: Take 1 tablet by mouth 2 (two) times daily.    Dispense:  14 tablet    Refill:  0   fluticasone  (FLONASE ) 50 MCG/ACT nasal spray    Sig: Place 2 sprays into both nostrils daily.    Dispense:  16 g    Refill:  6    Return if symptoms worsen or fail to improve.  Curtis DELENA Boom, FNP  I, Curtis DELENA Boom, FNP, have reviewed all documentation for this visit. The documentation on 02/27/24 for the exam, diagnosis, procedures, and orders are all accurate and complete.

## 2024-02-28 ENCOUNTER — Telehealth: Payer: Self-pay

## 2024-02-28 DIAGNOSIS — M65221 Calcific tendinitis, right upper arm: Secondary | ICD-10-CM | POA: Diagnosis not present

## 2024-02-28 NOTE — Telephone Encounter (Signed)
 Spoke with pt made aware no ear drops were sent in, only abx and nasal spray.pt verbalized understanding.

## 2024-02-28 NOTE — Telephone Encounter (Signed)
 Copied from CRM [id number]. Topic: Clinical - Medication Question >> Feb 28, 2024 10:56 AM Willma R wrote: Reason for CRM: Patient states during his visit yesterday he was advised a new ear drop would be prescribed for him for two weeks, instead of using what he got from urgent care. No prescription for any ear drop was sent to his pharmacy. Would like to know if one is being sent or if he should continue to use what he has.  Patient can be reached [phone]

## 2024-02-29 ENCOUNTER — Encounter: Payer: Self-pay | Admitting: Family Medicine

## 2024-02-29 ENCOUNTER — Ambulatory Visit (INDEPENDENT_AMBULATORY_CARE_PROVIDER_SITE_OTHER): Admitting: Family Medicine

## 2024-02-29 VITALS — BP 95/67 | HR 77 | Ht 70.0 in | Wt 190.0 lb

## 2024-02-29 DIAGNOSIS — M7741 Metatarsalgia, right foot: Secondary | ICD-10-CM | POA: Diagnosis not present

## 2024-02-29 DIAGNOSIS — R61 Generalized hyperhidrosis: Secondary | ICD-10-CM | POA: Insufficient documentation

## 2024-02-29 DIAGNOSIS — M7742 Metatarsalgia, left foot: Secondary | ICD-10-CM

## 2024-02-29 DIAGNOSIS — E0590 Thyrotoxicosis, unspecified without thyrotoxic crisis or storm: Secondary | ICD-10-CM

## 2024-02-29 DIAGNOSIS — E739 Lactose intolerance, unspecified: Secondary | ICD-10-CM | POA: Insufficient documentation

## 2024-02-29 DIAGNOSIS — G6289 Other specified polyneuropathies: Secondary | ICD-10-CM

## 2024-02-29 DIAGNOSIS — B351 Tinea unguium: Secondary | ICD-10-CM | POA: Insufficient documentation

## 2024-02-29 MED ORDER — CICLOPIROX 8 % EX SOLN: Freq: Every day | CUTANEOUS | 5 refills | Status: DC

## 2024-02-29 NOTE — Progress Notes (Signed)
 Established patient visit   Patient: [name] [name]   DOB: 23-Mar-1971   53 y.o. Male  MRN: [id number] Visit Date: 02/29/2024  Today's healthcare provider: LAURAINE LOISE BUOY, DO   Chief Complaint  Patient presents with   Follow-up    Sweats still ongoing   Urinary Frequency    Urinate frequency but feeling to go but not much of urinate unsure if one of medication is causing.   Foot Pain    Bottom of foot bilateral pain across top area    Subjective    HPI [name] [name] is a 53 year old male who presents with excessive sweating and foot discomfort.  He experiences excessive sweating, particularly during meals and sleep. The sweating is profuse, making him feel as if he is 'running outside', and results in soaked bed and clothes. The sweating occurs inconsistently at night, with some nights being dry and others resulting in significant perspiration.  He has discomfort in the bottom of both feet, describing a sensation of walking on something hard without cushion along his toe box bilaterally. This issue has been persistent despite spending a significant amount on supportive shoes due to his frequent walking. - he follows with [facility]  He mentions a recent issue with urination, where he experienced a weak stream and difficulty initiating urination. However, this has resolved, and he is now urinating normally. He has a history of kidney stones.  He has been experiencing loose bowel movements, which have improved with the use of lactase supplements before consuming dairy products. He sometimes needs to take more than the recommended dose for effectiveness.  He is currently taking methylprednisolone for elbow pain and has started vitamin D  supplements as recommended by his doctor. He is unsure if he has started methimazole , but believes he would have if it was prescribed.  He works long hours, often 14-15 hours a day, which he attributes  to his fatigue. No current issues with bowel movements, except for a recent minor discharge. No current issues with urination. Denies regular use of pain medication like Tylenol  or ibuprofen.      Medications: Outpatient Medications Prior to Visit  Medication Sig   amoxicillin -clavulanate (AUGMENTIN ) 875-125 MG tablet Take 1 tablet by mouth 2 (two) times daily.   atorvastatin  (LIPITOR) 10 MG tablet Take 1 tablet by mouth at bedtime.   Cholecalciferol (VITAMIN D3) 125 MCG (5000 UT) CAPS Take 1 capsule by mouth daily.   Continuous Glucose Sensor (DEXCOM G7 SENSOR) MISC 1 each by Does not apply route as directed. Every 10 days   FARXIGA 5 MG TABS tablet Take 1 tablet by mouth daily.   fluticasone  (FLONASE ) 50 MCG/ACT nasal spray Place 2 sprays into both nostrils daily.   Insulin  Aspart FlexPen (NOVOLOG ) 100 UNIT/ML Inject 6 Units into the skin daily as needed (high blood sugar).   lisinopril  (ZESTRIL ) 2.5 MG tablet Take 1 tablet by mouth daily.   methimazole  (TAPAZOLE ) 5 MG tablet Take 1 tablet (5 mg total) by mouth 3 (three) times daily.   No facility-administered medications prior to visit.        Objective    BP 95/67 (BP Location: Left Arm, Patient Position: Sitting, Cuff Size: Normal)   Pulse 77   Ht 5' 10 (1.778 m)   Wt 190 lb (86.2 kg)   SpO2 99%   BMI 27.26 kg/m     Physical Exam Vitals and nursing note reviewed.  Constitutional:  General: He is not in acute distress.    Appearance: Normal appearance.  HENT:     Head: Normocephalic and atraumatic.  Eyes:     General: No scleral icterus.    Conjunctiva/sclera: Conjunctivae normal.  Cardiovascular:     Rate and Rhythm: Normal rate.  Pulmonary:     Effort: Pulmonary effort is normal.  Feet:     Comments: Onychomycosis noted to right great toenail, left great toenail and nail of 5th toe on left side Neurological:     Mental Status: He is alert and oriented to person, place, and time. Mental status is at  baseline.  Psychiatric:        Mood and Affect: Mood normal.        Behavior: Behavior normal.      Results for orders placed or performed in visit on 02/29/24  HM DIABETES EYE EXAM  Result Value Ref Range   HM Diabetic Eye Exam Retinopathy (A) No Retinopathy    Assessment & Plan    Generalized hyperhidrosis  Hyperthyroidism -     TSH+T3+ThyAbs+TPO Ab+TRAb+T...  Metatarsalgia of both feet  Onychomycosis -     Ciclopirox ; Apply topically at bedtime. Apply over nail and surrounding skin. Apply daily over previous coat. After seven (7) days, may remove with alcohol and continue cycle.  Dispense: 6.6 mL; Refill: 5  Other polyneuropathy  Late-onset lactose intolerance     Hyperhidrosis; hypothyroidism Persistent excessive sweating, especially during meals and at night. No improvement with current treatment. Possible thyroid dysfunction. - Thyroid ultrasound previously ordered; advised patient to schedule. - Recheck thyroid levels and antibodies now that he has been on methimazole  for approximately 4 weeks.  Metatarsalgia Pain in metatarsal region, possibly due to fungal infection or neuropathy. Supportive shoes used. - Refer to podiatry for further evaluation. - Advise icing and massaging the area. - Recommend trying orthotics from a specialized store. - Suggest switching to different shoes if necessary.  Onychomycosis of the Toenails Discoloration and changes in toenails suggest fungal infection. Topical treatment preferred. - Prescribe Ciclopirox  topical solution for affected toenails. - Advise keeping feet dry and using antifungal spray or powder.  Peripheral Neuropathy Decreased sensation in feet, likely related to diabetes. Risk of unnoticed injuries. - Educate on checking feet regularly for wounds. - Advise using hands to check water temperature before foot immersion.  Lactose Intolerance Intermittent loose stools with dairy. Improvement with lactase enzyme,  higher doses needed. - Advise checking lactase enzyme pill strength and using higher dose if necessary.  Vitamin D  Deficiency Vitamin D  deficiency noted.  OTC supplementation initiated. - Continue vitamin D  supplementation.    Return in about 3 months (around 05/31/2024) for Chronic f/u, foot, thyroid.      I discussed the assessment and treatment plan with the patient  The patient was provided an opportunity to ask questions and all were answered. The patient agreed with the plan and demonstrated an understanding of the instructions.   The patient was advised to call back or seek an in-person evaluation if the symptoms worsen or if the condition fails to improve as anticipated.    LAURAINE LOISE BUOY, DO  [facility] [phone] (phone) [phone] (fax)  [facility]

## 2024-02-29 NOTE — Patient Instructions (Addendum)
 You can use Gold Bond (or similar) foot powder and/or Tinactin spray between your toes to help prevent skin breakdown.  Please call [facility] to schedule your thyroid ultrasound.  [phone]    For your feet -> try to rest, ice, pain relievers, and supportive footwear with orthotics like metatarsal pads.

## 2024-03-07 ENCOUNTER — Other Ambulatory Visit: Payer: Self-pay | Admitting: Family Medicine

## 2024-03-07 DIAGNOSIS — E0590 Thyrotoxicosis, unspecified without thyrotoxic crisis or storm: Secondary | ICD-10-CM

## 2024-03-07 NOTE — Telephone Encounter (Signed)
 Copied from CRM [id number]. Topic: General - Other >> Mar 07, 2024  9:36 AM Travis F wrote: Reason for CRM: Patient is calling in because he says the medication for his ear is not working and he still cannot hear.

## 2024-03-08 ENCOUNTER — Telehealth: Payer: Self-pay

## 2024-03-08 DIAGNOSIS — H65191 Other acute nonsuppurative otitis media, right ear: Secondary | ICD-10-CM

## 2024-03-08 DIAGNOSIS — H938X1 Other specified disorders of right ear: Secondary | ICD-10-CM

## 2024-03-08 DIAGNOSIS — H6991 Unspecified Eustachian tube disorder, right ear: Secondary | ICD-10-CM

## 2024-03-08 NOTE — Telephone Encounter (Signed)
 I have spoke with pt earlier and informed of referral ENT has been placed. I have also routed message to PCP to see if any further recommendations need to be given to pt.

## 2024-03-08 NOTE — Telephone Encounter (Signed)
 Noted! Thank you

## 2024-03-08 NOTE — Telephone Encounter (Signed)
 This is an fyi, but wanted to make you aware.   Patient called in and message was relay by front staff at 10:34am. Pt wants clinical to call him to dicuss a medication not working for his ears & what's the next step.  Per provider note 02/27/24  - If symptom persists or worsen f/u with PCP - Consider ENT referral if symptoms persist.  Called pt at 11:25 am. Patient stated has been on two different medication and no communication has been made to him. This is the first phone encounter made regarding pt not improving from ear.  Patient has been made aware referral has been placed for ENT and our referral team will contact pt to set an appt up. Patient verbalized understanding.

## 2024-03-08 NOTE — Telephone Encounter (Signed)
 Please confirm with patient a referral is being placed for Ear, Nose, and Throat.

## 2024-03-08 NOTE — Telephone Encounter (Signed)
 Copied from CRM [id number]. Topic: Clinical - Medical Advice >> Mar 08, 2024 10:18 AM Chasity T wrote: Reason for CRM: Patient is calling in with concerns that the medication used for his ears are still not working and he would like for Dr Donzella nurse to call him back to discuss what the next steps should be. Please contact patient back regarding his concerns at [phone]

## 2024-03-15 LAB — TSH+T3+THYABS+TPO AB+TRAB+T...
Anti-Thyroglobulin Antibodies: <1 [IU]/mL
Anti-Thyroid Peroxidase Ab: <9 [IU]/mL
Free T-3: 2.8 pg/mL
Free T4 by Dialysis: 1.5 ng/dL
Reverse T3,  LCMS Endo Sci: 21.7 ng/dL
TSH Receptor Antibody (TBII): <0.3 U/L
TSH: 0.18 uU/mL — ABNORMAL LOW
Triiodothyronine (T-3), Serum: 71 ng/dL

## 2024-03-16 ENCOUNTER — Ambulatory Visit: Payer: Self-pay | Admitting: Family Medicine

## 2024-03-16 DIAGNOSIS — E0590 Thyrotoxicosis, unspecified without thyrotoxic crisis or storm: Secondary | ICD-10-CM

## 2024-03-16 MED ORDER — METHIMAZOLE 10 MG PO TABS: 10.0000 mg | ORAL_TABLET | Freq: Three times a day (TID) | ORAL | 0 refills | Status: DC

## 2024-03-21 ENCOUNTER — Encounter: Payer: Self-pay | Admitting: Family Medicine

## 2024-04-05 DIAGNOSIS — Z794 Long term (current) use of insulin: Secondary | ICD-10-CM | POA: Diagnosis not present

## 2024-04-05 DIAGNOSIS — E1140 Type 2 diabetes mellitus with diabetic neuropathy, unspecified: Secondary | ICD-10-CM | POA: Diagnosis not present

## 2024-04-18 DIAGNOSIS — H9041 Sensorineural hearing loss, unilateral, right ear, with unrestricted hearing on the contralateral side: Secondary | ICD-10-CM | POA: Diagnosis not present

## 2024-04-19 ENCOUNTER — Other Ambulatory Visit: Payer: Self-pay | Admitting: Unknown Physician Specialty

## 2024-04-19 DIAGNOSIS — H9041 Sensorineural hearing loss, unilateral, right ear, with unrestricted hearing on the contralateral side: Secondary | ICD-10-CM

## 2024-05-02 ENCOUNTER — Other Ambulatory Visit

## 2024-05-03 ENCOUNTER — Encounter (HOSPITAL_BASED_OUTPATIENT_CLINIC_OR_DEPARTMENT_OTHER): Payer: Self-pay | Admitting: Emergency Medicine

## 2024-05-03 ENCOUNTER — Emergency Department (HOSPITAL_BASED_OUTPATIENT_CLINIC_OR_DEPARTMENT_OTHER)

## 2024-05-03 ENCOUNTER — Other Ambulatory Visit: Payer: Self-pay

## 2024-05-03 ENCOUNTER — Emergency Department (HOSPITAL_BASED_OUTPATIENT_CLINIC_OR_DEPARTMENT_OTHER)
Admission: EM | Admit: 2024-05-03 | Discharge: 2024-05-03 | Disposition: A | Discharge status: Home / Self Care | Attending: Emergency Medicine | Admitting: Emergency Medicine

## 2024-05-03 DIAGNOSIS — E875 Hyperkalemia: Secondary | ICD-10-CM | POA: Insufficient documentation

## 2024-05-03 DIAGNOSIS — R197 Diarrhea, unspecified: Secondary | ICD-10-CM | POA: Insufficient documentation

## 2024-05-03 DIAGNOSIS — D72829 Elevated white blood cell count, unspecified: Secondary | ICD-10-CM | POA: Insufficient documentation

## 2024-05-03 DIAGNOSIS — R112 Nausea with vomiting, unspecified: Secondary | ICD-10-CM | POA: Insufficient documentation

## 2024-05-03 DIAGNOSIS — R7401 Elevation of levels of liver transaminase levels: Secondary | ICD-10-CM | POA: Insufficient documentation

## 2024-05-03 DIAGNOSIS — R109 Unspecified abdominal pain: Secondary | ICD-10-CM | POA: Diagnosis not present

## 2024-05-03 DIAGNOSIS — N281 Cyst of kidney, acquired: Secondary | ICD-10-CM | POA: Diagnosis not present

## 2024-05-03 DIAGNOSIS — R1084 Generalized abdominal pain: Secondary | ICD-10-CM | POA: Insufficient documentation

## 2024-05-03 DIAGNOSIS — E119 Type 2 diabetes mellitus without complications: Secondary | ICD-10-CM | POA: Insufficient documentation

## 2024-05-03 LAB — RESP PANEL BY RT-PCR (RSV, FLU A&B, COVID)  RVPGX2
Influenza A by PCR: NEGATIVE
Influenza B by PCR: NEGATIVE
Resp Syncytial Virus by PCR: NEGATIVE
SARS Coronavirus 2 by RT PCR: NEGATIVE

## 2024-05-03 LAB — COMPREHENSIVE METABOLIC PANEL WITH GFR
ALT: 40 U/L (ref 0–44)
AST: 58 U/L — ABNORMAL HIGH (ref 15–41)
Albumin: 4.7 g/dL (ref 3.5–5.0)
Alkaline Phosphatase: 94 U/L (ref 38–126)
Anion gap: 19 — ABNORMAL HIGH (ref 5–15)
BUN: 18 mg/dL (ref 6–20)
CO2: 19 mmol/L — ABNORMAL LOW (ref 22–32)
Calcium: 9.4 mg/dL (ref 8.9–10.3)
Chloride: 99 mmol/L (ref 98–111)
Creatinine, Ser: 1.34 mg/dL — ABNORMAL HIGH (ref 0.61–1.24)
GFR, Estimated: >60 mL/min (ref >60)
Glucose, Bld: 159 mg/dL — ABNORMAL HIGH (ref 70–99)
Potassium: 5.4 mmol/L — ABNORMAL HIGH (ref 3.5–5.1)
Sodium: 136 mmol/L (ref 135–145)
Total Bilirubin: 1.5 mg/dL — ABNORMAL HIGH (ref 0.0–1.2)
Total Protein: 8.2 g/dL — ABNORMAL HIGH (ref 6.5–8.1)

## 2024-05-03 LAB — CBC
HCT: 52.2 % — ABNORMAL HIGH (ref 39.0–52.0)
Hemoglobin: 17 g/dL (ref 13.0–17.0)
MCH: 29.1 pg (ref 26.0–34.0)
MCHC: 32.6 g/dL (ref 30.0–36.0)
MCV: 89.4 fL (ref 80.0–100.0)
Platelets: 261 K/uL (ref 150–400)
RBC: 5.84 MIL/uL — ABNORMAL HIGH (ref 4.22–5.81)
RDW: 14.3 % (ref 11.5–15.5)
WBC: 11.2 K/uL — ABNORMAL HIGH (ref 4.0–10.5)
nRBC: 0 % (ref 0.0–0.2)

## 2024-05-03 LAB — URINALYSIS, ROUTINE W REFLEX MICROSCOPIC
Bilirubin Urine: NEGATIVE
Glucose, UA: >=500 mg/dL — AB
Ketones, ur: 80 mg/dL — AB
Leukocytes,Ua: NEGATIVE
Nitrite: NEGATIVE
Protein, ur: >=300 mg/dL — AB
Specific Gravity, Urine: 1.025 (ref 1.005–1.030)
pH: 5.5 (ref 5.0–8.0)

## 2024-05-03 LAB — URINALYSIS, MICROSCOPIC (REFLEX)

## 2024-05-03 LAB — CBG MONITORING, ED: Glucose-Capillary: 180 mg/dL — ABNORMAL HIGH (ref 70–99)

## 2024-05-03 LAB — LIPASE, BLOOD: Lipase: 63 U/L — ABNORMAL HIGH (ref 11–51)

## 2024-05-03 MED ORDER — PROMETHAZINE HCL 25 MG RE SUPP: 25.0000 mg | Freq: Four times a day (QID) | RECTAL | 0 refills | Status: DC | PRN

## 2024-05-03 MED ORDER — DICYCLOMINE HCL 10 MG/ML IM SOLN
20.0000 mg | Freq: Once | INTRAMUSCULAR | Status: AC
  Administered 2024-05-03: 20 mg via INTRAMUSCULAR
  Filled 2024-05-03: qty 2

## 2024-05-03 MED ORDER — ONDANSETRON 4 MG PO TBDP: 4.0000 mg | ORAL_TABLET | Freq: Three times a day (TID) | ORAL | 0 refills | Status: DC | PRN

## 2024-05-03 MED ORDER — SODIUM CHLORIDE 0.9 % IV SOLN
12.5000 mg | Freq: Four times a day (QID) | INTRAVENOUS | Status: DC | PRN
  Administered 2024-05-03: 12.5 mg via INTRAVENOUS
  Filled 2024-05-03: qty 0.5

## 2024-05-03 MED ORDER — PROMETHAZINE HCL 25 MG/ML IJ SOLN
INTRAMUSCULAR | Status: AC
  Filled 2024-05-03: qty 1

## 2024-05-03 MED ORDER — IOHEXOL 300 MG/ML  SOLN
100.0000 mL | Freq: Once | INTRAMUSCULAR | Status: AC | PRN
  Administered 2024-05-03: 100 mL via INTRAVENOUS

## 2024-05-03 MED ORDER — DICYCLOMINE HCL 20 MG PO TABS: 20.0000 mg | ORAL_TABLET | Freq: Two times a day (BID) | ORAL | 0 refills | Status: DC

## 2024-05-03 MED ORDER — SODIUM CHLORIDE 0.9 % IV BOLUS
1000.0000 mL | Freq: Once | INTRAVENOUS | Status: AC
  Administered 2024-05-03: 1000 mL via INTRAVENOUS

## 2024-05-03 MED ORDER — ONDANSETRON 4 MG PO TBDP
4.0000 mg | ORAL_TABLET | Freq: Once | ORAL | Status: AC
  Administered 2024-05-03: 4 mg via ORAL
  Filled 2024-05-03: qty 1

## 2024-05-03 NOTE — ED Provider Notes (Signed)
 [facility] Provider Note   CSN: [id number] Arrival date & time: 05/03/24  1325     Patient presents with: Emesis   [name] [name] is a 53 y.o. male past medical history significant for diabetes presents today for nausea, vomiting, and diarrhea that began around 11 PM last night.  Patient also reports diffuse abdominal pain and congestion.  Patient denies fever, chills, cough, hematemesis, blood in stool, dysuria, hematuria, chest pain, or shortness of breath.    Emesis Associated symptoms: abdominal pain and diarrhea        Prior to Admission medications   Medication Sig Start Date End Date Taking? Authorizing Provider  dicyclomine  (BENTYL ) 20 MG tablet Take 1 tablet (20 mg total) by mouth 2 (two) times daily. 05/03/24  Yes Francis Ileana SAILOR, PA-C  ondansetron  (ZOFRAN -ODT) 4 MG disintegrating tablet Take 1 tablet (4 mg total) by mouth every 8 (eight) hours as needed. 05/03/24  Yes Iretta Mangrum N, PA-C  amoxicillin -clavulanate (AUGMENTIN ) 875-125 MG tablet Take 1 tablet by mouth 2 (two) times daily. 02/27/24   Wellington Curtis LABOR, FNP  atorvastatin  (LIPITOR) 10 MG tablet Take 1 tablet by mouth at bedtime. 11/23/23   [provider]  Cholecalciferol (VITAMIN D3) 125 MCG (5000 UT) CAPS Take 1 capsule by mouth daily.    [provider]  ciclopirox  (PENLAC ) 8 % solution Apply topically at bedtime. Apply over nail and surrounding skin. Apply daily over previous coat. After seven (7) days, may remove with alcohol and continue cycle. 02/29/24   Donzella Lauraine SAILOR, DO  Continuous Glucose Sensor (DEXCOM G7 SENSOR) MISC 1 each by Does not apply route as directed. Every 10 days    [provider]  FARXIGA 5 MG TABS tablet Take 1 tablet by mouth daily. 11/03/23   [provider]  fluticasone  (FLONASE ) 50 MCG/ACT nasal spray Place 2 sprays into both nostrils daily. 02/27/24   Wellington Curtis LABOR, FNP  Insulin  Aspart FlexPen (NOVOLOG )  100 UNIT/ML Inject 6 Units into the skin daily as needed (high blood sugar). 07/10/19   [provider]  lisinopril  (ZESTRIL ) 2.5 MG tablet Take 1 tablet by mouth daily. 11/23/23   [provider]  methimazole  (TAPAZOLE ) 10 MG tablet Take 1 tablet (10 mg total) by mouth 3 (three) times daily. 03/16/24   Donzella Lauraine SAILOR, DO  promethazine  (PHENERGAN ) 25 MG suppository Place 1 suppository (25 mg total) rectally every 6 (six) hours as needed for refractory nausea / vomiting. 05/03/24   Francis Ileana SAILOR, PA-C    Allergies: Metformin  and Ozempic (0.25 or 0.5 mg-dose) [semaglutide(0.25 or 0.5mg -dos)]    Review of Systems  HENT:  Positive for congestion.   Gastrointestinal:  Positive for abdominal pain, diarrhea, nausea and vomiting.    Updated Vital Signs BP (!) 141/78   Pulse 88   Temp 99.1 F (37.3 C) (Oral)   Resp (!) 29   Wt 92.5 kg   SpO2 97%   BMI 29.27 kg/m   Physical Exam Vitals and nursing note reviewed.  Constitutional:      General: He is not in acute distress.    Appearance: Normal appearance. He is well-developed. He is not ill-appearing or diaphoretic.  HENT:     Head: Normocephalic and atraumatic.     Right Ear: External ear normal.     Left Ear: External ear normal.     Nose: Nose normal.  Eyes:     Conjunctiva/sclera: Conjunctivae normal.  Cardiovascular:  Rate and Rhythm: Normal rate and regular rhythm.     Pulses: Normal pulses.     Heart sounds: Normal heart sounds. No murmur heard. Pulmonary:     Effort: Pulmonary effort is normal. No respiratory distress.     Breath sounds: Normal breath sounds.  Abdominal:     General: There is no distension.     Palpations: Abdomen is soft.     Tenderness: There is generalized abdominal tenderness. There is no guarding. Negative signs include Murphy's sign and McBurney's sign.  Musculoskeletal:        General: No swelling.     Cervical back: Neck supple.  Skin:    General: Skin is warm and dry.      Capillary Refill: Capillary refill takes less than 2 seconds.  Neurological:     General: No focal deficit present.     Mental Status: He is alert and oriented to person, place, and time.  Psychiatric:        Mood and Affect: Mood normal.     (all labs ordered are listed, but only abnormal results are displayed) Labs Reviewed  LIPASE, BLOOD - Abnormal; Notable for the following components:      Result Value   Lipase 63 (*)    All other components within normal limits  COMPREHENSIVE METABOLIC PANEL WITH GFR - Abnormal; Notable for the following components:   Potassium 5.4 (*)    CO2 19 (*)    Glucose, Bld 159 (*)    Creatinine, Ser 1.34 (*)    Total Protein 8.2 (*)    AST 58 (*)    Total Bilirubin 1.5 (*)    Anion gap 19 (*)    All other components within normal limits  CBC - Abnormal; Notable for the following components:   WBC 11.2 (*)    RBC 5.84 (*)    HCT 52.2 (*)    All other components within normal limits  URINALYSIS, ROUTINE W REFLEX MICROSCOPIC - Abnormal; Notable for the following components:   Glucose, UA >=500 (*)    Hgb urine dipstick LARGE (*)    Ketones, ur 80 (*)    Protein, ur >=300 (*)    All other components within normal limits  URINALYSIS, MICROSCOPIC (REFLEX) - Abnormal; Notable for the following components:   Bacteria, UA RARE (*)    All other components within normal limits  CBG MONITORING, ED - Abnormal; Notable for the following components:   Glucose-Capillary 180 (*)    All other components within normal limits  RESP PANEL BY RT-PCR (RSV, FLU A&B, COVID)  [id number]    EKG: None  Radiology: CT ABDOMEN PELVIS W CONTRAST Result Date: 05/03/2024 CLINICAL DATA:  Acute generalized abdominal pain and emesis. EXAM: CT ABDOMEN AND PELVIS WITH CONTRAST TECHNIQUE: Multidetector CT imaging of the abdomen and pelvis was performed using the standard protocol following bolus administration of intravenous contrast. RADIATION DOSE REDUCTION: This exam was  performed according to the departmental dose-optimization program which includes automated exposure control, adjustment of the mA and/or kV according to patient size and/or use of iterative reconstruction technique. CONTRAST:  OMNIPAQUE  IOHEXOL  300 MG/ML  SOLN COMPARISON:  October 19, 2023. FINDINGS: Lower chest: No acute abnormality. Hepatobiliary: No focal liver abnormality is seen. No gallstones, gallbladder wall thickening, or biliary dilatation. Pancreas: Unremarkable. No pancreatic ductal dilatation or surrounding inflammatory changes. Spleen: Normal in size without focal abnormality. Adrenals/Urinary Tract: Adrenal glands appear normal. Bilateral nonobstructive nephrolithiasis is noted. Bilateral renal cysts are noted.  No hydronephrosis or renal obstruction is noted. Urinary bladder is unremarkable. Stomach/Bowel: Stomach is within normal limits. Appendix appears normal. No evidence of bowel wall thickening, distention, or inflammatory changes. Vascular/Lymphatic: No significant vascular findings are present. No enlarged abdominal or pelvic lymph nodes. Reproductive: Prostate is unremarkable. Other: No abdominal wall hernia or abnormality. No abdominopelvic ascites. Reservoir for penile implant is noted in right side of pelvis anteriorly. Musculoskeletal: No acute or significant osseous findings. IMPRESSION: 1. Bilateral nonobstructive nephrolithiasis. No hydronephrosis or renal obstruction is noted. 2. No other abnormality seen in the abdomen or pelvis. Electronically Signed   By: Lynwood Landy Raddle M.D.   On: 05/03/2024 17:24     Procedures   Medications Ordered in the ED  promethazine  (PHENERGAN ) 12.5 mg in sodium chloride  0.9 % 50 mL IVPB (0 mg Intravenous Stopped 05/03/24 1845)  promethazine  (PHENERGAN ) 25 MG/ML injection (  Not Given 05/03/24 1724)  ondansetron  (ZOFRAN -ODT) disintegrating tablet 4 mg (4 mg Oral Given 05/03/24 1540)  sodium chloride  0.9 % bolus 1,000 mL (0 mLs Intravenous Stopped  05/03/24 1845)  iohexol  (OMNIPAQUE ) 300 MG/ML solution 100 mL (100 mLs Intravenous Contrast Given 05/03/24 1648)  dicyclomine  (BENTYL ) injection 20 mg (20 mg Intramuscular Given 05/03/24 1759)                                    Medical Decision Making Amount and/or Complexity of Data Reviewed Labs: ordered.   This patient presents to the ED for concern of nausea, vomiting, diarrhea with abdominal pain, this involves an extensive number of treatment options, and is a complaint that carries with it a high risk of complications and morbidity.  The differential diagnosis includes COVID, flu, RSV, viral GI illness, pancreatitis, choledocholithiasis, acute cholecystitis, diverticulitis, SBO   Co morbidities / Chronic conditions that complicate the patient evaluation  Diabetes   Additional history obtained:  Additional history obtained from EMR External records from outside source obtained and reviewed including Care Everywhere   Lab Tests:  I Ordered, and personally interpreted labs.  The pertinent results include: Mild hyperkalemia at 5.4, mildly elevated creatinine at 1.34 which is around baseline per historical values, mildly elevated lipase at 63, mildly elevated AST at 58, mildly elevated total bili at 1.5, mild leukocytosis at 11.2, respiratory panel negative, greater than 500 glucose, large hemoglobin, 80 ketones, greater than 300 protein, rare bacteria   Imaging Studies ordered:  I ordered imaging studies including CT abdomen pelvis with contrast I independently visualized and interpreted imaging which showed bilateral nonobstructive nephrolithiasis.  No other abnormality seen in the abdomen or pelvis. I agree with the radiologist interpretation   Cardiac Monitoring: / EKG:  The patient was maintained on a cardiac monitor.  I personally viewed and interpreted the cardiac monitored which showed an underlying rhythm of: Sinus rhythm, probable LAE   Problem List / ED Course /  Critical interventions / Medication management I ordered medication including Zofran , Phenergan , IVF I have reviewed the patients home medicines and have made adjustments as needed Patient tolerating p.o. intake without issue prior to discharge  Test / Admission - Considered:  Considered for admission or further workup however patient's vital signs, physical exam, labs, and imaging are reassuring.  Patient's symptoms likely due to viral GI illness.  Patient given outpatient course of Zofran  for nausea and vomiting and Bentyl  for abdominal cramping.  Patient also given Phenergan  suppositories for refractory nausea and vomiting.  Patient given return precautions.  I feel patient is safe for discharge at this time.     Final diagnoses:  Nausea vomiting and diarrhea  Generalized abdominal pain    ED Discharge Orders          Ordered    dicyclomine  (BENTYL ) 20 MG tablet  2 times daily        05/03/24 1849    ondansetron  (ZOFRAN -ODT) 4 MG disintegrating tablet  Every 8 hours PRN        05/03/24 1849    promethazine  (PHENERGAN ) 25 MG suppository  Every 6 hours PRN,   Status:  Discontinued        05/03/24 1849    promethazine  (PHENERGAN ) 25 MG suppository  Every 6 hours PRN        05/03/24 1849               Francis Ileana SAILOR, PA-C 05/03/24 1851    Ruthe Cornet, DO 05/03/24 2201

## 2024-05-03 NOTE — ED Notes (Signed)
 Pt alert and oriented X 4 at the time of discharge. RR even and unlabored. No acute distress noted. Pt verbalized understanding of discharge instructions as discussed. Pt ambulatory to lobby at time of discharge.

## 2024-05-03 NOTE — ED Triage Notes (Addendum)
 Emesis x 1 day , Hx type 2 diabetes , CBG 162. Active emesis during triage . Abd pain . Reports no Hx DKA . Adds has diarrhea and congestion

## 2024-05-03 NOTE — Discharge Instructions (Signed)
 Today you were seen for abdominal pain with nausea, vomiting, and diarrhea.  I suspect this is likely from a viral illness.  Please pick up your medications and take as prescribed.  Please return to the ED if you have uncontrollable vomiting, worsening pain, or fever that does not go down with Tylenol  or Motrin.  Thank you for letting us  treat you today. After reviewing your labs and imaging, I feel you are safe to go home. Please follow up with your PCP in the next several days and provide them with your records from this visit. Return to the Emergency Room if pain becomes severe or symptoms worsen.

## 2024-05-04 ENCOUNTER — Telehealth (INDEPENDENT_AMBULATORY_CARE_PROVIDER_SITE_OTHER): Admitting: Physician Assistant

## 2024-05-04 ENCOUNTER — Ambulatory Visit: Payer: Self-pay

## 2024-05-04 DIAGNOSIS — R112 Nausea with vomiting, unspecified: Secondary | ICD-10-CM

## 2024-05-04 DIAGNOSIS — R197 Diarrhea, unspecified: Secondary | ICD-10-CM

## 2024-05-04 NOTE — Telephone Encounter (Signed)
 FYI Only or Action Required?: Action required by provider: request for appointment.  Patient was last seen in primary care on 02/29/2024 by Donzella Lauraine SAILOR, DO.  Called Nurse Triage reporting Nausea.  Symptoms began several days ago.  Interventions attempted: Rest, hydration, or home remedies.  Symptoms are: gradually improving.  Triage Disposition: See HCP Within 4 Hours (Or PCP Triage)  Patient/caregiver understands and will follow disposition?: YesCopied from CRM [id number]. Topic: Clinical - Red Word Triage >> May 04, 2024 12:03 PM Donna BRAVO wrote: Red Word that prompted transfer to Nurse Triage: patient ED last night for nausea,vomiting,diarrhea, stomach cramping. Not getting better, no diarrhea or vomiting, he believes it's due to not eating Reason for Disposition  [1] Constant abdominal pain AND [2] present > 2 hours  Answer Assessment - Initial Assessment Questions Pt went to ED yesterday. Given phenergan  suppository  script but pt stated he will not take those and is requesting phenergan  pill. Pt stated he hasn't vomited since being home but did dry-heave during triage. No diarrhea today. Pt feels weak but is moving around. Pt has virtual appt today at 1340.    1. VOMITING SEVERITY: How many times have you vomited in the past 24 hours?      15 2. ONSET: When did the vomiting begin?      Wednesday night  3. FLUIDS: What fluids or food have you vomited up today? Have you been able to keep any fluids down?     Sipping water 4. ABDOMEN PAIN: Are your having any abdomen pain? If Yes : How bad is it and what does it feel like? (e.g., crampy, dull, intermittent, constant)      Yes-burning, cramping 5. DIARRHEA: Is there any diarrhea? If Yes, ask: How many times today?      Not since yesterday  6. CONTACTS: Is there anyone else in the family with the same symptoms?      At job 7. CAUSE: What do you think is causing your vomiting?     Virus 8. HYDRATION STATUS:  Any signs of dehydration? (e.g., dry mouth [not only dry lips], too weak to stand) When did you last urinate?     Feels weak 9. OTHER SYMPTOMS: Do you have any other symptoms? (e.g., fever, headache, vertigo, vomiting blood or coffee grounds, recent head injury)     diarrhea  Protocols used: Vomiting-A-AH

## 2024-05-05 NOTE — Telephone Encounter (Signed)
 Patient seen by a provider at [facility] same day.

## 2024-05-06 NOTE — Progress Notes (Signed)
 " MyChart Video Visit  Virtual Visit via Video Note   This format is felt to be most appropriate for this patient at this time. Physical exam was limited by quality of the video and audio technology used for the visit.   Provider location: Virtual Visit Location Provider: Office/Clinic Patient Location: Home  I discussed the limitations of evaluation and management by telemedicine and the availability of in person appointments. The patient expressed understanding and agreed to proceed.  Patient: [name] [name]   DOB: 1970-11-07   53 y.o. Male  MRN: [id number] Visit Date: 05/04/2024  Today's healthcare provider: Jolynn Spencer, PA-C   No chief complaint on file.  Subjective     Discussed the use of AI scribe software for clinical note transcription with the patient, who gave verbal consent to proceed.  History of Present Illness [name] [name] is a 53 year old male who presents with diarrhea, vomiting, and abdominal cramping.  Symptoms began Wednesday night with diarrhea and vomiting. Abdominal pain is described as a burning sensation with cramping. Diarrhea continued through Thursday, and vomiting occurred once or twice on Friday. Appetite is reduced, potentially worsening symptoms.  He has a history of stomach issues related to previous Ozempic use, which he has discontinued. He suspects a viral cause due to similar symptoms in coworkers, though they do not have vomiting. He denies unusual food intake or travel.  He struggles to keep fluids down, with drinks often being vomited. Diarrhea is watery without blood. He does not recall a fever but experienced sweating on Thursday. No muscle pain is present. COVID test is negative.   Medications: Outpatient Medications Prior to Visit  Medication Sig   amoxicillin -clavulanate (AUGMENTIN ) 875-125 MG tablet Take 1 tablet by mouth 2 (two) times daily.   atorvastatin  (LIPITOR) 10 MG tablet Take 1 tablet by mouth at  bedtime.   Cholecalciferol (VITAMIN D3) 125 MCG (5000 UT) CAPS Take 1 capsule by mouth daily.   ciclopirox  (PENLAC ) 8 % solution Apply topically at bedtime. Apply over nail and surrounding skin. Apply daily over previous coat. After seven (7) days, may remove with alcohol and continue cycle.   Continuous Glucose Sensor (DEXCOM G7 SENSOR) MISC 1 each by Does not apply route as directed. Every 10 days   dicyclomine  (BENTYL ) 20 MG tablet Take 1 tablet (20 mg total) by mouth 2 (two) times daily.   FARXIGA 5 MG TABS tablet Take 1 tablet by mouth daily.   fluticasone  (FLONASE ) 50 MCG/ACT nasal spray Place 2 sprays into both nostrils daily.   Insulin  Aspart FlexPen (NOVOLOG ) 100 UNIT/ML Inject 6 Units into the skin daily as needed (high blood sugar).   lisinopril  (ZESTRIL ) 2.5 MG tablet Take 1 tablet by mouth daily.   methimazole  (TAPAZOLE ) 10 MG tablet Take 1 tablet (10 mg total) by mouth 3 (three) times daily.   ondansetron  (ZOFRAN -ODT) 4 MG disintegrating tablet Take 1 tablet (4 mg total) by mouth every 8 (eight) hours as needed.   promethazine  (PHENERGAN ) 25 MG suppository Place 1 suppository (25 mg total) rectally every 6 (six) hours as needed for refractory nausea / vomiting.   No facility-administered medications prior to visit.    Review of Systems All negative  Except see HPI       Objective    There were no vitals taken for this visit.       Physical Exam  Constitutional:      General: She is not in acute distress.    Appearance:  Normal appearance.  HENT:     Head: Normocephalic.  Pulmonary:     Effort: Pulmonary effort is normal. No respiratory distress.  Neurological:     Mental Status: She is alert and oriented to person, place, and time. Mental status is at baseline.       Assessment & Plan Nausea/Vomiting/Diarrhea Acute viral gastroenteritis Suspected Symptoms of nausea, vomiting, and diarrhea suggest viral gastroenteritis, likely contracted from workplace  exposure. Negative COVID test. Decreased vomiting but concerns about dehydration persist. - Prescribed Zofran  for nausea and vomiting. - Ensure Zofran  prescription pickup from [facility] in [place]. - Prescribed Bentyl  20 mg twice daily for abdominal cramping. - Encouraged sipping small amounts of electrolytes to prevent dehydration. - Instructed to contact if symptoms worsen or do not improve.   No follow-ups on file.     I discussed the assessment and treatment plan with the patient. The patient was provided an opportunity to ask questions and all were answered. The patient agreed with the plan and demonstrated an understanding of the instructions.   The patient was advised to call back or seek an in-person evaluation if the symptoms worsen or if the condition fails to improve as anticipated.  I, [place] Mielke, PA-C have reviewed all documentation for this visit. The documentation on 05/04/2024  for the exam, diagnosis, procedures, and orders are all accurate and complete.  Jolynn Spencer, [facility], MMS [facility] [phone] (phone) [phone] (fax)  [facility]  "

## 2024-05-07 ENCOUNTER — Other Ambulatory Visit: Payer: Self-pay

## 2024-05-07 ENCOUNTER — Ambulatory Visit: Payer: Self-pay

## 2024-05-07 ENCOUNTER — Encounter (HOSPITAL_BASED_OUTPATIENT_CLINIC_OR_DEPARTMENT_OTHER): Payer: Self-pay | Admitting: Emergency Medicine

## 2024-05-07 ENCOUNTER — Emergency Department (HOSPITAL_BASED_OUTPATIENT_CLINIC_OR_DEPARTMENT_OTHER)
Admission: EM | Admit: 2024-05-07 | Discharge: 2024-05-07 | Disposition: A | Discharge status: Home / Self Care | Attending: Emergency Medicine | Admitting: Emergency Medicine

## 2024-05-07 DIAGNOSIS — R109 Unspecified abdominal pain: Secondary | ICD-10-CM | POA: Diagnosis not present

## 2024-05-07 DIAGNOSIS — E039 Hypothyroidism, unspecified: Secondary | ICD-10-CM | POA: Diagnosis not present

## 2024-05-07 DIAGNOSIS — Z794 Long term (current) use of insulin: Secondary | ICD-10-CM | POA: Diagnosis not present

## 2024-05-07 DIAGNOSIS — E1142 Type 2 diabetes mellitus with diabetic polyneuropathy: Secondary | ICD-10-CM | POA: Insufficient documentation

## 2024-05-07 DIAGNOSIS — E860 Dehydration: Secondary | ICD-10-CM | POA: Insufficient documentation

## 2024-05-07 DIAGNOSIS — R197 Diarrhea, unspecified: Secondary | ICD-10-CM | POA: Insufficient documentation

## 2024-05-07 LAB — CBC WITH DIFFERENTIAL/PLATELET
Abs Immature Granulocytes: 0.09 K/uL — ABNORMAL HIGH (ref 0.00–0.07)
Basophils Absolute: 0 K/uL (ref 0.0–0.1)
Basophils Relative: 0 %
Eosinophils Absolute: 0 K/uL (ref 0.0–0.5)
Eosinophils Relative: 0 %
HCT: 52.7 % — ABNORMAL HIGH (ref 39.0–52.0)
Hemoglobin: 17.8 g/dL — ABNORMAL HIGH (ref 13.0–17.0)
Immature Granulocytes: 1 %
Lymphocytes Relative: 14 %
Lymphs Abs: 1.5 K/uL (ref 0.7–4.0)
MCH: 29.8 pg (ref 26.0–34.0)
MCHC: 33.8 g/dL (ref 30.0–36.0)
MCV: 88.1 fL (ref 80.0–100.0)
Monocytes Absolute: 1.1 K/uL — ABNORMAL HIGH (ref 0.1–1.0)
Monocytes Relative: 10 %
Neutro Abs: 7.8 K/uL — ABNORMAL HIGH (ref 1.7–7.7)
Neutrophils Relative %: 75 %
Platelets: 254 K/uL (ref 150–400)
RBC: 5.98 MIL/uL — ABNORMAL HIGH (ref 4.22–5.81)
RDW: 13.3 % (ref 11.5–15.5)
WBC: 10.6 K/uL — ABNORMAL HIGH (ref 4.0–10.5)
nRBC: 0 % (ref 0.0–0.2)

## 2024-05-07 LAB — URINALYSIS, MICROSCOPIC (REFLEX)

## 2024-05-07 LAB — BASIC METABOLIC PANEL WITH GFR
Anion gap: 13 (ref 5–15)
BUN: 28 mg/dL — ABNORMAL HIGH (ref 6–20)
CO2: 26 mmol/L (ref 22–32)
Calcium: 9.8 mg/dL (ref 8.9–10.3)
Chloride: 94 mmol/L — ABNORMAL LOW (ref 98–111)
Creatinine, Ser: 1.2 mg/dL (ref 0.61–1.24)
GFR, Estimated: >60 mL/min (ref >60)
Glucose, Bld: 182 mg/dL — ABNORMAL HIGH (ref 70–99)
Potassium: 3.7 mmol/L (ref 3.5–5.1)
Sodium: 133 mmol/L — ABNORMAL LOW (ref 135–145)

## 2024-05-07 LAB — URINALYSIS, ROUTINE W REFLEX MICROSCOPIC
Glucose, UA: 100 mg/dL — AB
Ketones, ur: 80 mg/dL — AB
Leukocytes,Ua: NEGATIVE
Nitrite: NEGATIVE
Protein, ur: >=300 mg/dL — AB
Specific Gravity, Urine: >=1.03 (ref 1.005–1.030)
pH: 5.5 (ref 5.0–8.0)

## 2024-05-07 MED ORDER — FAMOTIDINE 20 MG PO TABS
20.0000 mg | ORAL_TABLET | Freq: Once | ORAL | Status: AC
  Administered 2024-05-07: 20 mg via ORAL
  Filled 2024-05-07: qty 1

## 2024-05-07 MED ORDER — OMEPRAZOLE 20 MG PO CPDR: 20.0000 mg | DELAYED_RELEASE_CAPSULE | Freq: Every day | ORAL | 0 refills | Status: DC

## 2024-05-07 MED ORDER — SODIUM CHLORIDE 0.9 % IV BOLUS
1000.0000 mL | Freq: Once | INTRAVENOUS | Status: AC
  Administered 2024-05-07: 1000 mL via INTRAVENOUS

## 2024-05-07 NOTE — Discharge Instructions (Signed)
 Omeprazole  has been prescribed to help manage acid reflux.  It is advised that you follow-up with primary care for definitive management of acid reflux.  Continue to try to drink fluids at home and use antinausea medications as needed for nausea.  Ease back into eating solid foods and avoid anything high in fats or grease.  If symptoms worsen or new symptoms arise return to ED for further evaluation.

## 2024-05-07 NOTE — ED Triage Notes (Addendum)
 States has had nausea since being d/c on Thursday and acid reflux  states  has  NOT  used to the phenergan  supp that was rx

## 2024-05-07 NOTE — ED Provider Notes (Signed)
 [place] [facility] Provider Note   CSN: [id number] Arrival date & time: 05/07/24  9086     Patient presents with: Nausea   [name] [name] is a 53 y.o. male.  53 year old male presents to ED with complaints of several days of abdominal pain with nausea.  Patient reports he was seen here Thursday and treated with fluids and antiemetics.  Patient reports he felt better after fluids.  Patient advises since leaving on Thursday he has had abdominal pain that feels like acid reflux.  He also reports nausea after drinking or eating.  Patient advises he has had a significant decrease in p.o. intake.  Patient denies any syncope, chest pain, vomiting, or dizziness.  Patient is requesting fluids.  Patient reports he had not taken the Phenergan  suppository and would prefer not to do that if possible.    Prior to Admission medications   Medication Sig Start Date End Date Taking? Authorizing Provider  omeprazole  (PRILOSEC) 20 MG capsule Take 1 capsule (20 mg total) by mouth daily. 05/07/24  Yes Myriam Fonda RAMAN, PA-C  amoxicillin -clavulanate (AUGMENTIN ) 875-125 MG tablet Take 1 tablet by mouth 2 (two) times daily. 02/27/24   Clifton, Kellie A, FNP  atorvastatin  (LIPITOR) 10 MG tablet Take 1 tablet by mouth at bedtime. 11/23/23   [provider]  Cholecalciferol (VITAMIN D3) 125 MCG (5000 UT) CAPS Take 1 capsule by mouth daily.    [provider]  ciclopirox  (PENLAC ) 8 % solution Apply topically at bedtime. Apply over nail and surrounding skin. Apply daily over previous coat. After seven (7) days, may remove with alcohol and continue cycle. 02/29/24   Donzella Lauraine SAILOR, DO  Continuous Glucose Sensor (DEXCOM G7 SENSOR) MISC 1 each by Does not apply route as directed. Every 10 days    [provider]  dicyclomine  (BENTYL ) 20 MG tablet Take 1 tablet (20 mg total) by mouth 2 (two) times daily. 05/03/24   Francis Ileana SAILOR, PA-C  FARXIGA 5 MG TABS tablet Take 1  tablet by mouth daily. 11/03/23   [provider]  fluticasone  (FLONASE ) 50 MCG/ACT nasal spray Place 2 sprays into both nostrils daily. 02/27/24   Wellington Curtis LABOR, FNP  Insulin  Aspart FlexPen (NOVOLOG ) 100 UNIT/ML Inject 6 Units into the skin daily as needed (high blood sugar). 07/10/19   [provider]  lisinopril  (ZESTRIL ) 2.5 MG tablet Take 1 tablet by mouth daily. 11/23/23   [provider]  methimazole  (TAPAZOLE ) 10 MG tablet Take 1 tablet (10 mg total) by mouth 3 (three) times daily. 03/16/24   Donzella Lauraine SAILOR, DO  ondansetron  (ZOFRAN -ODT) 4 MG disintegrating tablet Take 1 tablet (4 mg total) by mouth every 8 (eight) hours as needed. 05/03/24   Keith, Kayla N, PA-C  promethazine  (PHENERGAN ) 25 MG suppository Place 1 suppository (25 mg total) rectally every 6 (six) hours as needed for refractory nausea / vomiting. 05/03/24   Francis Ileana SAILOR, PA-C    Allergies: Metformin  and Ozempic (0.25 or 0.5 mg-dose) [semaglutide(0.25 or 0.5mg -dos)]    Review of Systems  Gastrointestinal:  Positive for abdominal pain and nausea.  All other systems reviewed and are negative.   Updated Vital Signs BP (!) 173/107   Pulse 71   Temp 98.8 F (37.1 C) (Oral)   Resp 18   Ht 5' 10 (1.778 m)   Wt 89.8 kg   SpO2 96%   BMI 28.41 kg/m   Physical Exam Vitals and nursing note reviewed.  Constitutional:  Appearance: Normal appearance.  HENT:     Head: Normocephalic and atraumatic.     Nose: Nose normal.  Eyes:     Extraocular Movements: Extraocular movements intact.     Conjunctiva/sclera: Conjunctivae normal.     Pupils: Pupils are equal, round, and reactive to light.  Cardiovascular:     Rate and Rhythm: Normal rate.  Pulmonary:     Effort: Pulmonary effort is normal. No respiratory distress.  Abdominal:     General: Abdomen is flat.     Palpations: Abdomen is soft.     Tenderness: There is no abdominal tenderness. There is no guarding.  Musculoskeletal:         General: No swelling. Normal range of motion.     Cervical back: Normal range of motion.     Right lower leg: No edema.     Left lower leg: No edema.  Skin:    General: Skin is warm.     Capillary Refill: Capillary refill takes less than 2 seconds.     Coloration: Skin is not jaundiced.  Neurological:     General: No focal deficit present.     Mental Status: He is alert.  Psychiatric:        Mood and Affect: Mood normal.        Behavior: Behavior normal.     (all labs ordered are listed, but only abnormal results are displayed) Labs Reviewed  BASIC METABOLIC PANEL WITH GFR - Abnormal; Notable for the following components:      Result Value   Sodium 133 (*)    Chloride 94 (*)    Glucose, Bld 182 (*)    BUN 28 (*)    All other components within normal limits  CBC WITH DIFFERENTIAL/PLATELET - Abnormal; Notable for the following components:   WBC 10.6 (*)    RBC 5.98 (*)    Hemoglobin 17.8 (*)    HCT 52.7 (*)    Neutro Abs 7.8 (*)    Monocytes Absolute 1.1 (*)    Abs Immature Granulocytes 0.09 (*)    All other components within normal limits  URINALYSIS, ROUTINE W REFLEX MICROSCOPIC - Abnormal; Notable for the following components:   Glucose, UA 100 (*)    Hgb urine dipstick MODERATE (*)    Bilirubin Urine SMALL (*)    Ketones, ur 80 (*)    Protein, ur >=300 (*)    All other components within normal limits  URINALYSIS, MICROSCOPIC (REFLEX) - Abnormal; Notable for the following components:   Bacteria, UA RARE (*)    All other components within normal limits    EKG: None  Radiology: No results found.   Procedures   Medications Ordered in the ED  sodium chloride  0.9 % bolus 1,000 mL (0 mLs Intravenous Stopped 05/07/24 1110)  famotidine  (PEPCID ) tablet 20 mg (20 mg Oral Given 05/07/24 1101)   53 y.o. male presents to the ED with complaints of dehydration and abdominal pain, this involves an extensive number of treatment options, and is a complaint that carries with  it a high risk of complications and morbidity.  The differential diagnosis includes appendicitis, cholecystitis, diverticulitis, gastritis, ACS, electrolyte abnormality, bowel obstruction, UTI, pyelonephritis, (Ddx)  On arrival pt is nontoxic, vitals hypertensive. Exam significant for very mild abdominal pain throughout with bowel sounds.  Additional history obtained from chart review significant for hypothyroidism, diabetes, polyneuropathy managed by PCP.  Patient also reports he has history of complications from omeprazole  but has since discontinued medication.  I ordered medication Pepcid  for GERD  Lab Tests:  I Ordered, reviewed, and interpreted labs, which included: BMP, CBC, UA   ED Course:   53 year old male presents ED with complaints of continued abdominal pain and secondary dehydration.  Patient reports he was seen in the ED on Thursday and has not been able to keep down liquids or food since.  He advises he does not vomit them up but feels very nauseous after consuming them.  Patient was given suppositories at home for nausea and has not used them.  Patient reports some mild relief with the Bentyl  and omeprazole .  Patient reports he wants some fluids because he felt much better after getting them in the hospital last week.  Patient has negative Murphy's and McBurney's.  Very mild pain to palpation throughout entire abdomen.  Patient reports urine has been darker than usual with some odd odors.  Patient has no CVA tenderness.  There is no shortness of breath and patient appears in no acute distress.  Patient denies any chest pain or pain in his chest upon movement.  Patient does endorse some acid reflux with history.  Patient denies any GERD symptoms currently.  Initial plan is to rule out electrolyte abnormalities and give fluids for dehydration.  After reevaluation patient sitting comfortably in ED bed nontoxic complaining of no new complaints.  Patient reported feeling better after the  fluid and asking what he can do about GERD symptoms.  He currently only takes Tums.  It was advised we would prescribe him some omeprazole  outpatient and it was strongly recommended that he follow-up with PCP for definitive management.  Patient was comfortable with treatment plan and he reports he feels much better after getting the fluid.  Patient was advised if symptoms worsen or if new symptoms arise to return for further evaluation.    Portions of this note were generated with Scientist, clinical (histocompatibility and immunogenetics). Dictation errors may occur despite best attempts at proofreading.    Final diagnoses:  Dehydration    ED Discharge Orders          Ordered    omeprazole  (PRILOSEC) 20 MG capsule  Daily        05/07/24 1134               Myriam Fonda RAMAN, PA-C 05/07/24 1336    Long, Kirsti Mcalpine G, MD 05/08/24 [phone]

## 2024-05-07 NOTE — Telephone Encounter (Signed)
 FYI Only or Action Required?: FYI only for provider.  Patient was last seen in primary care on 05/04/2024 by Ostwalt, Janna, PA-C.  Called Nurse Triage reporting Nausea.  Symptoms began unknown.  Interventions attempted: Rest, hydration, or home remedies.  Symptoms are: gradually worsening.  Triage Disposition: Go to ED Now (or PCP Triage)  Patient/caregiver understands and will follow disposition?: Yes    Copied from CRM [id number]. Topic: Clinical - Red Word Triage >> May 07, 2024  8:20 AM [place] MATSU wrote: Red Word that prompted transfer to Nurse Triage: unable to drink or keep food down per patient he feels dehydrated. Reason for Disposition  [1] Drinking very little AND [2] dehydration suspected (e.g., no urine > 12 hours, very dry mouth, very lightheaded)  Answer Assessment - Initial Assessment Questions Patient called in and stated he is having severe nausea and unable to hold down any foods or fluids. He states he is not vomiting but this has occurred in the past and when he felt this bad he had to go to ED to receive IV fluids to get better. Patient asked if IV fluids can be given in office. This Rn informed patient they cannot and patient stated he will be going to ED. This RN advised patient to follow up with PCP to continue care for this medical issue and to assess for any prophylactic capability in the future. Patient verbalized understanding.  Protocols used: Nausea-A-AH

## 2024-05-09 ENCOUNTER — Inpatient Hospital Stay: Admission: RE | Admit: 2024-05-09 | Source: Ambulatory Visit

## 2024-05-15 ENCOUNTER — Encounter: Payer: Self-pay | Admitting: Unknown Physician Specialty

## 2024-05-16 ENCOUNTER — Other Ambulatory Visit

## 2024-05-22 ENCOUNTER — Ambulatory Visit
Admission: RE | Admit: 2024-05-22 | Discharge: 2024-05-22 | Disposition: A | Discharge status: Home / Self Care | Source: Ambulatory Visit | Attending: Unknown Physician Specialty | Admitting: Unknown Physician Specialty

## 2024-05-22 DIAGNOSIS — H9191 Unspecified hearing loss, right ear: Secondary | ICD-10-CM | POA: Diagnosis not present

## 2024-05-22 DIAGNOSIS — H9041 Sensorineural hearing loss, unilateral, right ear, with unrestricted hearing on the contralateral side: Secondary | ICD-10-CM

## 2024-05-22 MED ORDER — GADOPICLENOL 0.5 MMOL/ML IV SOLN
9.0000 mL | Freq: Once | INTRAVENOUS | Status: AC | PRN
  Administered 2024-05-22: 9 mL via INTRAVENOUS

## 2024-05-23 ENCOUNTER — Inpatient Hospital Stay: Admitting: Family Medicine

## 2024-05-23 NOTE — Progress Notes (Deleted)
 Established patient visit   Patient: [name] [name]   DOB: 1971/02/12   53 y.o. Male  MRN: [id number] Visit Date: 05/23/2024  Today's healthcare provider: LAURAINE LOISE BUOY, DO   No chief complaint on file.  Subjective    HPI    ***  05/03/2024 Prescribed Bentyl  twice daily Zofran  ODT Phenergan  suppository -declined to take  05/07/2024 complaints of several days of abdominal pain with nausea.  Patient reports he was seen here Thursday and treated with fluids and antiemetics.  Patient reports he felt better after fluids.  Patient advises since leaving on Thursday he has had abdominal pain that feels like acid reflux.  He also reports nausea after drinking or eating.  Patient advises he has had a significant decrease in p.o. intake.  Patient denies any syncope, chest pain, vomiting, or dizziness.  Patient is requesting fluids.  Patient reports he had not taken the Phenergan  suppository and would prefer not to do that if possible.   53 year old male presents ED with complaints of continued abdominal pain and secondary dehydration.  Patient reports he was seen in the ED on Thursday and has not been able to keep down liquids or food since.  He advises he does not vomit them up but feels very nauseous after consuming them.  Patient was given suppositories at home for nausea and has not used them.  Patient reports some mild relief with the Bentyl  and omeprazole .  Patient reports he wants some fluids because he felt much better after getting them in the hospital last week.  Patient has negative Murphy's and McBurney's.  Very mild pain to palpation throughout entire abdomen.  Patient reports urine has been darker than usual with some odd odors.  Patient has no CVA tenderness.  There is no shortness of breath and patient appears in no acute distress.  Patient denies any chest pain or pain in his chest upon movement.  Patient does endorse some acid reflux with history.  Patient denies any GERD  symptoms currently.  Initial plan is to rule out electrolyte abnormalities and give fluids for dehydration.   For GERD symptoms -  currently only takes Tums.  advised we would prescribe him some omeprazole  outpatient and it was strongly recommended that he follow-up with PCP for definitive management.  {History (Optional):23778}  Medications: Outpatient Medications Prior to Visit  Medication Sig   amoxicillin -clavulanate (AUGMENTIN ) 875-125 MG tablet Take 1 tablet by mouth 2 (two) times daily.   atorvastatin  (LIPITOR) 10 MG tablet Take 1 tablet by mouth at bedtime.   Cholecalciferol (VITAMIN D3) 125 MCG (5000 UT) CAPS Take 1 capsule by mouth daily.   ciclopirox  (PENLAC ) 8 % solution Apply topically at bedtime. Apply over nail and surrounding skin. Apply daily over previous coat. After seven (7) days, may remove with alcohol and continue cycle.   Continuous Glucose Sensor (DEXCOM G7 SENSOR) MISC 1 each by Does not apply route as directed. Every 10 days   dicyclomine  (BENTYL ) 20 MG tablet Take 1 tablet (20 mg total) by mouth 2 (two) times daily.   FARXIGA 5 MG TABS tablet Take 1 tablet by mouth daily.   fluticasone  (FLONASE ) 50 MCG/ACT nasal spray Place 2 sprays into both nostrils daily.   Insulin  Aspart FlexPen (NOVOLOG ) 100 UNIT/ML Inject 6 Units into the skin daily as needed (high blood sugar).   lisinopril  (ZESTRIL ) 2.5 MG tablet Take 1 tablet by mouth daily.   methimazole  (TAPAZOLE ) 10 MG tablet Take 1 tablet (10  mg total) by mouth 3 (three) times daily.   omeprazole  (PRILOSEC) 20 MG capsule Take 1 capsule (20 mg total) by mouth daily.   ondansetron  (ZOFRAN -ODT) 4 MG disintegrating tablet Take 1 tablet (4 mg total) by mouth every 8 (eight) hours as needed.   promethazine  (PHENERGAN ) 25 MG suppository Place 1 suppository (25 mg total) rectally every 6 (six) hours as needed for refractory nausea / vomiting.   No facility-administered medications prior to visit.    Review of  Systems ***  {Insert previous labs (optional):23779} {See past labs  Heme  Chem  Endocrine  Serology  Results Review (optional):1}   Objective    There were no vitals taken for this visit. {Insert last BP/Wt (optional):23777}{See vitals history (optional):1}   Physical Exam   No results found for any visits on 05/23/24.  Assessment & Plan    There are no diagnoses linked to this encounter.  ***  No follow-ups on file.      I discussed the assessment and treatment plan with the patient  The patient was provided an opportunity to ask questions and all were answered. The patient agreed with the plan and demonstrated an understanding of the instructions.   The patient was advised to call back or seek an in-person evaluation if the symptoms worsen or if the condition fails to improve as anticipated.    LAURAINE LOISE BUOY, DO  [facility] [phone] (phone) [phone] (fax)  [facility]

## 2024-06-21 ENCOUNTER — Ambulatory Visit: Admitting: Family Medicine

## 2024-06-29 ENCOUNTER — Ambulatory Visit: Admitting: Family Medicine

## 2024-07-23 ENCOUNTER — Encounter (HOSPITAL_COMMUNITY): Payer: Self-pay | Admitting: *Deleted

## 2024-07-23 ENCOUNTER — Emergency Department (HOSPITAL_COMMUNITY)
Admission: EM | Admit: 2024-07-23 | Discharge: 2024-07-23 | Disposition: A | Discharge status: Home / Self Care | Attending: Emergency Medicine | Admitting: Emergency Medicine

## 2024-07-23 ENCOUNTER — Other Ambulatory Visit: Payer: Self-pay

## 2024-07-23 DIAGNOSIS — E119 Type 2 diabetes mellitus without complications: Secondary | ICD-10-CM | POA: Diagnosis not present

## 2024-07-23 DIAGNOSIS — Z79899 Other long term (current) drug therapy: Secondary | ICD-10-CM | POA: Diagnosis not present

## 2024-07-23 DIAGNOSIS — Z794 Long term (current) use of insulin: Secondary | ICD-10-CM | POA: Diagnosis not present

## 2024-07-23 DIAGNOSIS — R197 Diarrhea, unspecified: Secondary | ICD-10-CM | POA: Diagnosis not present

## 2024-07-23 DIAGNOSIS — R112 Nausea with vomiting, unspecified: Secondary | ICD-10-CM | POA: Diagnosis not present

## 2024-07-23 DIAGNOSIS — I10 Essential (primary) hypertension: Secondary | ICD-10-CM | POA: Diagnosis not present

## 2024-07-23 DIAGNOSIS — R748 Abnormal levels of other serum enzymes: Secondary | ICD-10-CM | POA: Diagnosis not present

## 2024-07-23 LAB — COMPREHENSIVE METABOLIC PANEL WITH GFR
ALT: 39 U/L (ref 0–44)
AST: 41 U/L (ref 15–41)
Albumin: 4.6 g/dL (ref 3.5–5.0)
Alkaline Phosphatase: 93 U/L (ref 38–126)
Anion gap: 14 (ref 5–15)
BUN: 28 mg/dL — ABNORMAL HIGH (ref 6–20)
CO2: 22 mmol/L (ref 22–32)
Calcium: 9.6 mg/dL (ref 8.9–10.3)
Chloride: 100 mmol/L (ref 98–111)
Creatinine, Ser: 1.44 mg/dL — ABNORMAL HIGH (ref 0.61–1.24)
GFR, Estimated: 58 mL/min — ABNORMAL LOW (ref >60)
Glucose, Bld: 267 mg/dL — ABNORMAL HIGH (ref 70–99)
Potassium: 4 mmol/L (ref 3.5–5.1)
Sodium: 136 mmol/L (ref 135–145)
Total Bilirubin: 2.4 mg/dL — ABNORMAL HIGH (ref 0.0–1.2)
Total Protein: 8.9 g/dL — ABNORMAL HIGH (ref 6.5–8.1)

## 2024-07-23 LAB — CBC
HCT: 57.5 % — ABNORMAL HIGH (ref 39.0–52.0)
Hemoglobin: 18.6 g/dL — ABNORMAL HIGH (ref 13.0–17.0)
MCH: 30 pg (ref 26.0–34.0)
MCHC: 32.3 g/dL (ref 30.0–36.0)
MCV: 92.7 fL (ref 80.0–100.0)
Platelets: 286 K/uL (ref 150–400)
RBC: 6.2 MIL/uL — ABNORMAL HIGH (ref 4.22–5.81)
RDW: 13.2 % (ref 11.5–15.5)
WBC: 9.9 K/uL (ref 4.0–10.5)
nRBC: 0 % (ref 0.0–0.2)

## 2024-07-23 LAB — URINALYSIS, ROUTINE W REFLEX MICROSCOPIC
Bilirubin Urine: NEGATIVE
Glucose, UA: >=500 mg/dL — AB
Ketones, ur: 5 mg/dL — AB
Leukocytes,Ua: NEGATIVE
Nitrite: NEGATIVE
Protein, ur: >=300 mg/dL — AB
Specific Gravity, Urine: 1.026 (ref 1.005–1.030)
pH: 5 (ref 5.0–8.0)

## 2024-07-23 LAB — BETA-HYDROXYBUTYRIC ACID: Beta-Hydroxybutyric Acid: 0.71 mmol/L — ABNORMAL HIGH (ref 0.05–0.27)

## 2024-07-23 LAB — I-STAT VENOUS BLOOD GAS, ED
Acid-Base Excess: 0 mmol/L (ref 0.0–2.0)
Bicarbonate: 25 mmol/L (ref 20.0–28.0)
Calcium, Ion: 1.1 mmol/L — ABNORMAL LOW (ref 1.15–1.40)
HCT: 50 % (ref 39.0–52.0)
Hemoglobin: 17 g/dL (ref 13.0–17.0)
O2 Saturation: 58 %
Potassium: 3.9 mmol/L (ref 3.5–5.1)
Sodium: 137 mmol/L (ref 135–145)
TCO2: 26 mmol/L (ref 22–32)
pCO2, Ven: 42.5 mmHg — ABNORMAL LOW (ref 44–60)
pH, Ven: 7.378 (ref 7.25–7.43)
pO2, Ven: 31 mmHg — CL (ref 32–45)

## 2024-07-23 LAB — CBG MONITORING, ED: Glucose-Capillary: 246 mg/dL — ABNORMAL HIGH (ref 70–99)

## 2024-07-23 LAB — LIPASE, BLOOD: Lipase: 150 U/L — ABNORMAL HIGH (ref 11–51)

## 2024-07-23 MED ORDER — ONDANSETRON 4 MG PO TBDP
8.0000 mg | ORAL_TABLET | Freq: Once | ORAL | Status: AC
  Administered 2024-07-23: 8 mg via ORAL
  Filled 2024-07-23: qty 2

## 2024-07-23 MED ORDER — METOCLOPRAMIDE HCL 5 MG/ML IJ SOLN
10.0000 mg | Freq: Once | INTRAMUSCULAR | Status: AC
  Administered 2024-07-23: 10 mg via INTRAVENOUS
  Filled 2024-07-23: qty 2

## 2024-07-23 MED ORDER — ONDANSETRON 4 MG PO TBDP: 4.0000 mg | ORAL_TABLET | Freq: Three times a day (TID) | ORAL | 0 refills | Status: DC | PRN

## 2024-07-23 MED ORDER — SODIUM CHLORIDE 0.9 % IV BOLUS
1000.0000 mL | Freq: Once | INTRAVENOUS | Status: AC
  Administered 2024-07-23: 1000 mL via INTRAVENOUS

## 2024-07-23 NOTE — ED Notes (Signed)
 Pt was able to tolerate 240 mL of diet sprite with no c/o /N

## 2024-07-23 NOTE — ED Triage Notes (Signed)
 Pt arrives with c/o n/v/d that started yesterday. Per pt, he has not been able to tolerate anything PO for the past 36 hours. Pt is a type 2 diabetic and his blood sugar has been running higher than normal for him. Pt reports dizziness and weakness. Pt denies CP or SOB.

## 2024-07-23 NOTE — Discharge Instructions (Signed)
 Zofran  as needed as prescribed for nausea and vomiting.  Please recheck with your primary care provider in the next 2 days.  Return to the ER for worsening or concerning symptoms.

## 2024-07-23 NOTE — ED Provider Notes (Signed)
 [place] EMERGENCY DEPARTMENT AT [facility] Provider Note   CSN: [id number] Arrival date & time: 07/23/24  9945     Patient presents with: Emesis and Diarrhea   [name] [name] is a 53 y.o. male.   53 year old male presents with complaint of nausea, vomiting, diarrhea.  Patient states that he went for a seafood boil on Saturday night, woke up Sunday morning with vomiting and diarrhea.  Unable to keep anything down all day yesterday.  Diarrhea has slowed but continues to be very nauseous and vomit.  Denies fevers.  No blood in emesis or stools.  Denies abdominal pain.  History of diabetes, states that he had problems with Ozempic that caused him to have vomiting abdominal pain episodes but this is resolved since stopping his Ozempic.  Also history of hyperlipidemia, hypertension       Prior to Admission medications   Medication Sig Start Date End Date Taking? Authorizing Provider  ondansetron  (ZOFRAN -ODT) 4 MG disintegrating tablet Take 1 tablet (4 mg total) by mouth every 8 (eight) hours as needed for nausea or vomiting. 07/23/24  Yes Beverley Leita LABOR, PA-C  amoxicillin -clavulanate (AUGMENTIN ) 875-125 MG tablet Take 1 tablet by mouth 2 (two) times daily. 02/27/24   Clifton, Kellie A, FNP  atorvastatin  (LIPITOR) 10 MG tablet Take 1 tablet by mouth at bedtime. 11/23/23   [provider]  Cholecalciferol (VITAMIN D3) 125 MCG (5000 UT) CAPS Take 1 capsule by mouth daily.    [provider]  ciclopirox  (PENLAC ) 8 % solution Apply topically at bedtime. Apply over nail and surrounding skin. Apply daily over previous coat. After seven (7) days, may remove with alcohol and continue cycle. 02/29/24   Donzella Lauraine SAILOR, DO  Continuous Glucose Sensor (DEXCOM G7 SENSOR) MISC 1 each by Does not apply route as directed. Every 10 days    [provider]  dicyclomine  (BENTYL ) 20 MG tablet Take 1 tablet (20 mg total) by mouth 2 (two) times daily. 05/03/24   Francis Ileana SAILOR,  PA-C  FARXIGA 5 MG TABS tablet Take 1 tablet by mouth daily. 11/03/23   [provider]  fluticasone  (FLONASE ) 50 MCG/ACT nasal spray Place 2 sprays into both nostrils daily. 02/27/24   Wellington Curtis LABOR, FNP  Insulin  Aspart FlexPen (NOVOLOG ) 100 UNIT/ML Inject 6 Units into the skin daily as needed (high blood sugar). 07/10/19   [provider]  lisinopril  (ZESTRIL ) 2.5 MG tablet Take 1 tablet by mouth daily. 11/23/23   [provider]  methimazole  (TAPAZOLE ) 10 MG tablet Take 1 tablet (10 mg total) by mouth 3 (three) times daily. 03/16/24   Donzella Lauraine SAILOR, DO  omeprazole  (PRILOSEC) 20 MG capsule Take 1 capsule (20 mg total) by mouth daily. 05/07/24   Myriam Fonda RAMAN, PA-C  promethazine  (PHENERGAN ) 25 MG suppository Place 1 suppository (25 mg total) rectally every 6 (six) hours as needed for refractory nausea / vomiting. 05/03/24   Keith, Kayla N, PA-C    Allergies: Metformin  and Ozempic (0.25 or 0.5 mg-dose) [semaglutide(0.25 or 0.5mg -dos)]    Review of Systems Negative except as per HPI Updated Vital Signs BP 131/70   Pulse 74   Temp 98.6 F (37 C) (Oral)   Resp 16   Ht 5' 10 (1.778 m)   Wt 89.8 kg   SpO2 100%   BMI 28.41 kg/m   Physical Exam Vitals and nursing note reviewed.  Constitutional:      General: He is not in acute distress.  Appearance: He is well-developed. He is obese. He is not diaphoretic.  HENT:     Head: Normocephalic and atraumatic.     Mouth/Throat:     Mouth: Mucous membranes are dry.  Cardiovascular:     Rate and Rhythm: Regular rhythm. Tachycardia present.     Heart sounds: Normal heart sounds.  Pulmonary:     Effort: Pulmonary effort is normal.     Breath sounds: Normal breath sounds.  Abdominal:     Palpations: Abdomen is soft.     Tenderness: There is no abdominal tenderness.  Musculoskeletal:     Right lower leg: No edema.     Left lower leg: No edema.  Skin:    General: Skin is warm and dry.     Findings: No erythema  or rash.  Neurological:     Mental Status: He is alert and oriented to person, place, and time.  Psychiatric:        Behavior: Behavior normal.     (all labs ordered are listed, but only abnormal results are displayed) Labs Reviewed  LIPASE, BLOOD - Abnormal; Notable for the following components:      Result Value   Lipase 150 (*)    All other components within normal limits  COMPREHENSIVE METABOLIC PANEL WITH GFR - Abnormal; Notable for the following components:   Glucose, Bld 267 (*)    BUN 28 (*)    Creatinine, Ser 1.44 (*)    Total Protein 8.9 (*)    Total Bilirubin 2.4 (*)    GFR, Estimated 58 (*)    All other components within normal limits  CBC - Abnormal; Notable for the following components:   RBC 6.20 (*)    Hemoglobin 18.6 (*)    HCT 57.5 (*)    All other components within normal limits  URINALYSIS, ROUTINE W REFLEX MICROSCOPIC - Abnormal; Notable for the following components:   APPearance HAZY (*)    Glucose, UA >=500 (*)    Hgb urine dipstick LARGE (*)    Ketones, ur 5 (*)    Protein, ur >=300 (*)    Bacteria, UA RARE (*)    All other components within normal limits  BETA-HYDROXYBUTYRIC ACID - Abnormal; Notable for the following components:   Beta-Hydroxybutyric Acid 0.71 (*)    All other components within normal limits  CBG MONITORING, ED - Abnormal; Notable for the following components:   Glucose-Capillary 246 (*)    All other components within normal limits  I-STAT VENOUS BLOOD GAS, ED - Abnormal; Notable for the following components:   pCO2, Ven 42.5 (*)    pO2, Ven 31 (*)    Calcium , Ion 1.10 (*)    All other components within normal limits    EKG: EKG Interpretation Date/Time:  Monday July 23 2024 01:34:41 EST Ventricular Rate:  81 PR Interval:  142 QRS Duration:  90 QT Interval:  368 QTC Calculation: 427 R Axis:   247  Text Interpretation: Normal sinus rhythm Right superior axis deviation Pulmonary disease pattern Abnormal ECG  Confirmed by Midge Golas ([id number]) on 07/23/2024 3:01:54 AM  Radiology: No results found.   Procedures   Medications Ordered in the ED  ondansetron  (ZOFRAN -ODT) disintegrating tablet 8 mg (8 mg Oral Given 07/23/24 0111)  sodium chloride  0.9 % bolus 1,000 mL (0 mLs Intravenous Stopped 07/23/24 0541)  metoCLOPramide  (REGLAN ) injection 10 mg (10 mg Intravenous Given 07/23/24 0300)  Medical Decision Making Risk Prescription drug management.   This patient presents to the ED for concern of nausea, vomiting, diarrhea, this involves an extensive number of treatment options, and is a complaint that carries with it a high risk of complications and morbidity.  The differential diagnosis includes but not limited to gastroenteritis, infectious diarrhea, electrolyte metabolic disturbance   Co morbidities / Chronic conditions that complicate the patient evaluation  Diabetes, migraines, hyperlipidemia, hypertension, neuropathy   Additional history obtained:  Additional history obtained from EMR External records from outside source obtained and reviewed including prior labs and imaging   Lab Tests:  I Ordered, and personally interpreted labs.  The pertinent results include: Urinalysis with large hemoglobin, 0-5 RBC, rare bacteria, ketones and protein, negative for leukocytes and nitrates.  CBC with hemoglobin 18.6 similar to prior although may also be contributed to hemoconcentration from vomiting/dehydration.  CMP with creatinine 1.44, similar to prior on file.  Bilirubin 2.4 without complaint of right upper quadrant pain, normal alk phos and LFTs.  Lipase is elevated at 150 again without complaint of abdominal pain.  VBG with normal pH.  Beta hydroxy elevated.   Problem List / ED Course / Critical interventions / Medication management  53 year old male with history of diabetes presents with complaint of nausea, vomiting, diarrhea after eating at a crab  boil last night.  Diarrhea had ceased on arrival however continued to have vomiting and nausea.  Provided with Zofran  in triage, remained nauseous and was given Reglan  and IV fluids in the department.  Symptoms resolved, patient feeling much better, the best I felt.  Discussed lab results.  No complaints of abdominal pain today, doubt pancreatitis.  Discharged with prescription for Zofran .  Recommend follow-up with PCP in 2 days for recheck.  Return to ER for worsening or concerning symptoms. I ordered medication including Zofran , IV fluids, Reglan  Reevaluation of the patient after these medicines showed that the patient symptoms resolved, patient tolerating p.o.'s. I have reviewed the patients home medicines and have made adjustments as needed   Consultations Obtained:  I requested consultation with the ER attending, Dr. Midge,  and discussed lab and imaging findings as well as pertinent plan - they recommend: Agrees with plan of care, in setting of normal bicarb, normal gap and normal pH, does not appear to be in DKA with concern for elevated beta hydroxy   Social Determinants of Health:  Has PCP for follow-up   Test / Admission - Considered:  Stable for discharge      Final diagnoses:  Nausea vomiting and diarrhea    ED Discharge Orders          Ordered    ondansetron  (ZOFRAN -ODT) 4 MG disintegrating tablet  Every 8 hours PRN        07/23/24 0535               Beverley Leita LABOR, PA-C 07/23/24 0548    Midge Golas, MD 07/23/24 [phone]

## 2024-07-25 ENCOUNTER — Emergency Department (HOSPITAL_COMMUNITY)

## 2024-07-25 ENCOUNTER — Emergency Department (HOSPITAL_COMMUNITY)
Admission: EM | Admit: 2024-07-25 | Discharge: 2024-07-25 | Disposition: A | Discharge status: Home / Self Care | Attending: Emergency Medicine | Admitting: Emergency Medicine

## 2024-07-25 ENCOUNTER — Other Ambulatory Visit: Payer: Self-pay

## 2024-07-25 DIAGNOSIS — Z79899 Other long term (current) drug therapy: Secondary | ICD-10-CM | POA: Insufficient documentation

## 2024-07-25 DIAGNOSIS — N281 Cyst of kidney, acquired: Secondary | ICD-10-CM | POA: Diagnosis not present

## 2024-07-25 DIAGNOSIS — I10 Essential (primary) hypertension: Secondary | ICD-10-CM | POA: Insufficient documentation

## 2024-07-25 DIAGNOSIS — R109 Unspecified abdominal pain: Secondary | ICD-10-CM | POA: Diagnosis not present

## 2024-07-25 DIAGNOSIS — E871 Hypo-osmolality and hyponatremia: Secondary | ICD-10-CM | POA: Diagnosis not present

## 2024-07-25 DIAGNOSIS — R112 Nausea with vomiting, unspecified: Secondary | ICD-10-CM | POA: Diagnosis not present

## 2024-07-25 DIAGNOSIS — E860 Dehydration: Secondary | ICD-10-CM | POA: Insufficient documentation

## 2024-07-25 DIAGNOSIS — E1140 Type 2 diabetes mellitus with diabetic neuropathy, unspecified: Secondary | ICD-10-CM | POA: Insufficient documentation

## 2024-07-25 DIAGNOSIS — N200 Calculus of kidney: Secondary | ICD-10-CM | POA: Diagnosis not present

## 2024-07-25 LAB — LIPASE, BLOOD: Lipase: 87 U/L — ABNORMAL HIGH (ref 11–51)

## 2024-07-25 LAB — URINALYSIS, ROUTINE W REFLEX MICROSCOPIC
Bilirubin Urine: NEGATIVE
Glucose, UA: 100 mg/dL — AB
Ketones, ur: 15 mg/dL — AB
Leukocytes,Ua: NEGATIVE
Nitrite: NEGATIVE
Protein, ur: 100 mg/dL — AB
Specific Gravity, Urine: >1.03 — ABNORMAL HIGH (ref 1.005–1.030)
pH: 6 (ref 5.0–8.0)

## 2024-07-25 LAB — URINALYSIS, MICROSCOPIC (REFLEX)

## 2024-07-25 LAB — CBC WITH DIFFERENTIAL/PLATELET
Abs Immature Granulocytes: 0.05 K/uL (ref 0.00–0.07)
Basophils Absolute: 0 K/uL (ref 0.0–0.1)
Basophils Relative: 0 %
Eosinophils Absolute: 0 K/uL (ref 0.0–0.5)
Eosinophils Relative: 0 %
HCT: 46.4 % (ref 39.0–52.0)
Hemoglobin: 15.5 g/dL (ref 13.0–17.0)
Immature Granulocytes: 1 %
Lymphocytes Relative: 16 %
Lymphs Abs: 1.6 K/uL (ref 0.7–4.0)
MCH: 30.6 pg (ref 26.0–34.0)
MCHC: 33.4 g/dL (ref 30.0–36.0)
MCV: 91.5 fL (ref 80.0–100.0)
Monocytes Absolute: 1 K/uL (ref 0.1–1.0)
Monocytes Relative: 9 %
Neutro Abs: 7.8 K/uL — ABNORMAL HIGH (ref 1.7–7.7)
Neutrophils Relative %: 74 %
Platelets: 293 K/uL (ref 150–400)
RBC: 5.07 MIL/uL (ref 4.22–5.81)
RDW: 12.7 % (ref 11.5–15.5)
WBC: 10.5 K/uL (ref 4.0–10.5)
nRBC: 0 % (ref 0.0–0.2)

## 2024-07-25 LAB — COMPREHENSIVE METABOLIC PANEL WITH GFR
ALT: 23 U/L (ref 0–44)
AST: 22 U/L (ref 15–41)
Albumin: 3 g/dL — ABNORMAL LOW (ref 3.5–5.0)
Alkaline Phosphatase: 64 U/L (ref 38–126)
Anion gap: 9 (ref 5–15)
BUN: 25 mg/dL — ABNORMAL HIGH (ref 6–20)
CO2: 26 mmol/L (ref 22–32)
Calcium: 8.4 mg/dL — ABNORMAL LOW (ref 8.9–10.3)
Chloride: 99 mmol/L (ref 98–111)
Creatinine, Ser: 1.07 mg/dL (ref 0.61–1.24)
GFR, Estimated: >60 mL/min (ref >60)
Glucose, Bld: 190 mg/dL — ABNORMAL HIGH (ref 70–99)
Potassium: 3.5 mmol/L (ref 3.5–5.1)
Sodium: 134 mmol/L — ABNORMAL LOW (ref 135–145)
Total Bilirubin: 2.4 mg/dL — ABNORMAL HIGH (ref 0.0–1.2)
Total Protein: 5.8 g/dL — ABNORMAL LOW (ref 6.5–8.1)

## 2024-07-25 LAB — MAGNESIUM: Magnesium: 1.9 mg/dL (ref 1.7–2.4)

## 2024-07-25 LAB — CBG MONITORING, ED: Glucose-Capillary: 256 mg/dL — ABNORMAL HIGH (ref 70–99)

## 2024-07-25 MED ORDER — IOHEXOL 350 MG/ML SOLN
75.0000 mL | Freq: Once | INTRAVENOUS | Status: AC | PRN
  Administered 2024-07-25: 75 mL via INTRAVENOUS

## 2024-07-25 MED ORDER — LIDOCAINE VISCOUS HCL 2 % MT SOLN
15.0000 mL | Freq: Once | OROMUCOSAL | Status: AC
  Administered 2024-07-25: 15 mL via ORAL
  Filled 2024-07-25: qty 15

## 2024-07-25 MED ORDER — ALUM & MAG HYDROXIDE-SIMETH 200-200-20 MG/5ML PO SUSP
30.0000 mL | Freq: Once | ORAL | Status: AC
  Administered 2024-07-25: 30 mL via ORAL
  Filled 2024-07-25: qty 30

## 2024-07-25 MED ORDER — METOCLOPRAMIDE HCL 5 MG/ML IJ SOLN
10.0000 mg | Freq: Once | INTRAMUSCULAR | Status: AC
  Administered 2024-07-25: 10 mg via INTRAVENOUS
  Filled 2024-07-25: qty 2

## 2024-07-25 MED ORDER — LACTATED RINGERS IV BOLUS
2000.0000 mL | Freq: Once | INTRAVENOUS | Status: AC
  Administered 2024-07-25: 2000 mL via INTRAVENOUS

## 2024-07-25 MED ORDER — ONDANSETRON HCL 4 MG/2ML IJ SOLN
4.0000 mg | Freq: Once | INTRAMUSCULAR | Status: AC
  Administered 2024-07-25: 4 mg via INTRAVENOUS
  Filled 2024-07-25: qty 2

## 2024-07-25 MED ORDER — ONDANSETRON 4 MG PO TBDP: 4.0000 mg | ORAL_TABLET | Freq: Three times a day (TID) | ORAL | 0 refills | Status: DC | PRN

## 2024-07-25 MED ORDER — OMEPRAZOLE 20 MG PO CPDR: 20.0000 mg | DELAYED_RELEASE_CAPSULE | Freq: Every day | ORAL | 2 refills | Status: DC

## 2024-07-25 MED ORDER — OXYCODONE-ACETAMINOPHEN 5-325 MG PO TABS
1.0000 | ORAL_TABLET | Freq: Once | ORAL | Status: AC
  Administered 2024-07-25: 1 via ORAL
  Filled 2024-07-25: qty 1

## 2024-07-25 MED ORDER — LISINOPRIL 10 MG PO TABS
5.0000 mg | ORAL_TABLET | Freq: Once | ORAL | Status: AC
  Administered 2024-07-25: 5 mg via ORAL
  Filled 2024-07-25: qty 1

## 2024-07-25 NOTE — ED Provider Notes (Signed)
 [place] EMERGENCY DEPARTMENT AT [facility] Provider Note   CSN: [id number] Arrival date & time: 07/25/24  9053     Patient presents with: Abdominal Pain, Emesis, and Nausea   [name] [name] is a 53 y.o. male.    Abdominal Pain Associated symptoms: diarrhea, fatigue, nausea and vomiting   Emesis Associated symptoms: abdominal pain and diarrhea   Patient presents for nausea and vomiting.  Medical history includes HTN, HLD, migraines, neuropathy, DM.  4 days ago, he went to a fish fry.  He feels like he ate some bad food there and he developed nausea, vomiting, diarrhea early the next morning.  He has continued to have nausea, vomiting, and p.o. intolerance.  He had diarrhea up until Sunday night.  He has not had any bowel movements since that time.  He has had a generalized abdominal pain, described as cramping sensation.      Prior to Admission medications   Medication Sig Start Date End Date Taking? Authorizing Provider  ondansetron  (ZOFRAN -ODT) 4 MG disintegrating tablet Take 1 tablet (4 mg total) by mouth every 8 (eight) hours as needed. 07/25/24  Yes Melvenia Motto, MD  amoxicillin -clavulanate (AUGMENTIN ) 875-125 MG tablet Take 1 tablet by mouth 2 (two) times daily. 02/27/24   Wellington Curtis LABOR, FNP  atorvastatin  (LIPITOR) 10 MG tablet Take 1 tablet by mouth at bedtime. 11/23/23   [provider]  Cholecalciferol (VITAMIN D3) 125 MCG (5000 UT) CAPS Take 1 capsule by mouth daily.    [provider]  ciclopirox  (PENLAC ) 8 % solution Apply topically at bedtime. Apply over nail and surrounding skin. Apply daily over previous coat. After seven (7) days, may remove with alcohol and continue cycle. 02/29/24   Donzella Lauraine SAILOR, DO  Continuous Glucose Sensor (DEXCOM G7 SENSOR) MISC 1 each by Does not apply route as directed. Every 10 days    [provider]  dicyclomine  (BENTYL ) 20 MG tablet Take 1 tablet (20 mg total) by mouth 2 (two) times daily. 05/03/24    Francis Ileana SAILOR, PA-C  FARXIGA 5 MG TABS tablet Take 1 tablet by mouth daily. 11/03/23   [provider]  fluticasone  (FLONASE ) 50 MCG/ACT nasal spray Place 2 sprays into both nostrils daily. 02/27/24   Wellington Curtis LABOR, FNP  Insulin  Aspart FlexPen (NOVOLOG ) 100 UNIT/ML Inject 6 Units into the skin daily as needed (high blood sugar). 07/10/19   [provider]  lisinopril  (ZESTRIL ) 2.5 MG tablet Take 1 tablet by mouth daily. 11/23/23   [provider]  methimazole  (TAPAZOLE ) 10 MG tablet Take 1 tablet (10 mg total) by mouth 3 (three) times daily. 03/16/24   Donzella Lauraine SAILOR, DO  omeprazole  (PRILOSEC) 20 MG capsule Take 1 capsule (20 mg total) by mouth daily. 07/25/24   Melvenia Motto, MD  ondansetron  (ZOFRAN -ODT) 4 MG disintegrating tablet Take 1 tablet (4 mg total) by mouth every 8 (eight) hours as needed for nausea or vomiting. 07/23/24   Beverley Leita LABOR, PA-C  promethazine  (PHENERGAN ) 25 MG suppository Place 1 suppository (25 mg total) rectally every 6 (six) hours as needed for refractory nausea / vomiting. 05/03/24   Keith, Kayla N, PA-C    Allergies: Metformin  and Ozempic (0.25 or 0.5 mg-dose) [semaglutide(0.25 or 0.5mg -dos)]    Review of Systems  Constitutional:  Positive for fatigue.  Gastrointestinal:  Positive for abdominal pain, diarrhea, nausea and vomiting.  Neurological:  Positive for weakness (Generalized).  All other systems reviewed and are negative.   Updated Vital Signs  BP (!) 174/92   Pulse 86   Temp 98.2 F (36.8 C) (Oral)   Resp (!) 22   Ht 5' 11 (1.803 m)   Wt 93 kg   SpO2 100%   BMI 28.59 kg/m   Physical Exam Vitals and nursing note reviewed.  Constitutional:      General: He is not in acute distress.    Appearance: He is well-developed. He is not ill-appearing, toxic-appearing or diaphoretic.  HENT:     Head: Normocephalic and atraumatic.  Eyes:     Extraocular Movements: Extraocular movements intact.     Conjunctiva/sclera:  Conjunctivae normal.  Cardiovascular:     Rate and Rhythm: Normal rate and regular rhythm.  Pulmonary:     Effort: Pulmonary effort is normal. No respiratory distress.  Abdominal:     Palpations: Abdomen is soft.     Tenderness: There is generalized abdominal tenderness. There is no guarding or rebound.  Musculoskeletal:        General: No swelling.     Cervical back: Neck supple.  Skin:    General: Skin is warm and dry.     Coloration: Skin is not jaundiced or pale.  Neurological:     General: No focal deficit present.     Mental Status: He is alert and oriented to person, place, and time.  Psychiatric:        Mood and Affect: Mood normal.        Behavior: Behavior normal.     (all labs ordered are listed, but only abnormal results are displayed) Labs Reviewed  CBC WITH DIFFERENTIAL/PLATELET - Abnormal; Notable for the following components:      Result Value   Neutro Abs 7.8 (*)    All other components within normal limits  COMPREHENSIVE METABOLIC PANEL WITH GFR - Abnormal; Notable for the following components:   Sodium 134 (*)    Glucose, Bld 190 (*)    BUN 25 (*)    Calcium  8.4 (*)    Total Protein 5.8 (*)    Albumin 3.0 (*)    Total Bilirubin 2.4 (*)    All other components within normal limits  LIPASE, BLOOD - Abnormal; Notable for the following components:   Lipase 87 (*)    All other components within normal limits  URINALYSIS, ROUTINE W REFLEX MICROSCOPIC - Abnormal; Notable for the following components:   Specific Gravity, Urine >1.030 (*)    Glucose, UA 100 (*)    Hgb urine dipstick MODERATE (*)    Ketones, ur 15 (*)    Protein, ur 100 (*)    All other components within normal limits  URINALYSIS, MICROSCOPIC (REFLEX) - Abnormal; Notable for the following components:   Bacteria, UA FEW (*)    All other components within normal limits  CBG MONITORING, ED - Abnormal; Notable for the following components:   Glucose-Capillary 256 (*)    All other components  within normal limits  MAGNESIUM    EKG: None  Radiology: CT ABDOMEN PELVIS W CONTRAST Result Date: 07/25/2024 EXAM: CT ABDOMEN AND PELVIS WITH CONTRAST 07/25/2024 09:23:40 PM TECHNIQUE: CT of the abdomen and pelvis was performed with the administration of 75 mL of iohexol  (OMNIPAQUE ) 350 MG/ML injection. Multiplanar reformatted images are provided for review. Automated exposure control, iterative reconstruction, and/or weight-based adjustment of the mA/kV was utilized to reduce the radiation dose to as low as reasonably achievable. COMPARISON: 05/03/2024 CLINICAL HISTORY: Abdominal pain, acute, nonlocalized. FINDINGS: LOWER CHEST: No acute abnormality. LIVER: The liver  is unremarkable. GALLBLADDER AND BILE DUCTS: Gallbladder is unremarkable. No biliary ductal dilatation. SPLEEN: No acute abnormality. PANCREAS: No acute abnormality. ADRENAL GLANDS: No acute abnormality. KIDNEYS, URETERS AND BLADDER: Small bilateral nonobstructing renal stones. Small bilateral renal cysts are stable, the largest 2 cm in the left mid pole. Per consensus, no follow-up is needed for simple Bosniak type 1 and 2 renal cysts, unless the patient has a malignancy history or risk factors. No hydronephrosis. No perinephric or periureteral stranding. Urinary bladder is unremarkable. GI AND BOWEL: Stomach demonstrates no acute abnormality. There is no bowel obstruction. PERITONEUM AND RETROPERITONEUM: No ascites. No free air. VASCULATURE: Aorta is normal in caliber. Aortic atherosclerosis. LYMPH NODES: No lymphadenopathy. REPRODUCTIVE ORGANS: Penile implant in place, grossly unremarkable. BONES AND SOFT TISSUES: No acute osseous abnormality. No focal soft tissue abnormality. IMPRESSION: 1. No acute findings in the abdomen or pelvis. 2. Bilateral nephrolithiasis. No hydronephrosis. 3. Aortic atherosclerosis. Electronically signed by: Franky Crease MD 07/25/2024 09:38 PM EST RP Workstation: [id number]     Procedures   Medications  Ordered in the ED  lisinopril  (ZESTRIL ) tablet 5 mg (has no administration in time range)  lactated ringers  bolus 2,000 mL (0 mLs Intravenous Stopped 07/25/24 1926)  metoCLOPramide  (REGLAN ) injection 10 mg (10 mg Intravenous Given 07/25/24 1610)  alum & mag hydroxide-simeth (MAALOX/MYLANTA) 200-200-20 MG/5ML suspension 30 mL (30 mLs Oral Given 07/25/24 1953)    And  lidocaine  (XYLOCAINE ) 2 % viscous mouth solution 15 mL (15 mLs Oral Given 07/25/24 1953)  ondansetron  (ZOFRAN ) injection 4 mg (4 mg Intravenous Given 07/25/24 1953)  oxyCODONE -acetaminophen  (PERCOCET/ROXICET) 5-325 MG per tablet 1 tablet (1 tablet Oral Given 07/25/24 1954)  iohexol  (OMNIPAQUE ) 350 MG/ML injection 75 mL (75 mLs Intravenous Contrast Given 07/25/24 2124)                                    Medical Decision Making Amount and/or Complexity of Data Reviewed Labs: ordered. Radiology: ordered.  Risk OTC drugs. Prescription drug management.   This patient presents to the ED for concern of nausea and vomiting, this involves an extensive number of treatment options, and is a complaint that carries with it a high risk of complications and morbidity.  The differential diagnosis includes enteritis, cyclic vomiting syndrome, dehydration, metabolic derangements, bowel obstruction, GERD, gastroparesis   Co morbidities / Chronic conditions that complicate the patient evaluation  HTN, HLD, migraines, neuropathy, DM   Additional history obtained:  Additional history obtained from EMR External records from outside source obtained and reviewed including N/A   Lab Tests:  I Ordered, and personally interpreted labs.  The pertinent results include: Normal hemoglobin, no leukocytosis, normal kidney function, normal electrolytes, mild hyperglycemia without evidence of DKA, slight elevations in bilirubin and lipase with otherwise normal hepatobiliary enzymes   Imaging Studies ordered:  I ordered imaging studies including  CT of abdomen and pelvis I independently visualized and interpreted imaging which showed no acute findings I agree with the radiologist interpretation   Cardiac Monitoring: / EKG:  The patient was maintained on a cardiac monitor.  I personally viewed and interpreted the cardiac monitored which showed an underlying rhythm of: Sinus rhythm   Problem List / ED Course / Critical interventions / Medication management  Patient presenting for nausea and vomiting for the past 3 days.  On arrival in the ED, vital signs notable for hypertension.  He is prescribed blood pressure medication but has had  p.o. intolerance to all food and fluids for the past 3 days.  IV fluids were ordered for hydration.  Reglan  ordered for nausea.  Workup initiated.  Patient's lab work results were unremarkable.  On reassessment, patient sleeping.  He underwent CT scan of abdomen and pelvis.  This did not show any acute findings to explain his recent symptoms.  On further reassessment, patient reports resolution of nausea.  He does feel comfortable with discharge home at this time. I ordered medication including IV fluids for hydration, Reglan  and Zofran  for nausea, GI cocktail for epigastric pain, Percocet for analgesia, lisinopril  for hypertension Reevaluation of the patient after these medicines showed that the patient improved I have reviewed the patients home medicines and have made adjustments as needed  Social Determinants of Health:  Lives independently     Final diagnoses:  Nausea and vomiting, unspecified vomiting type  Dehydration    ED Discharge Orders          Ordered    ondansetron  (ZOFRAN -ODT) 4 MG disintegrating tablet  Every 8 hours PRN        07/25/24 2203    omeprazole  (PRILOSEC) 20 MG capsule  Daily        07/25/24 2222               Melvenia Motto, MD 07/25/24 2223

## 2024-07-25 NOTE — ED Notes (Signed)
 PT wants to wait till he gets a room to get labs. He said they'll need an ultrasound. I tried once but was unsuccessful and he refused to be stuck again.

## 2024-07-25 NOTE — Discharge Instructions (Addendum)
 Your test results today were reassuring.  A prescription for a medication called ondansetron  was sent to your pharmacy.  Take as needed for nausea.  Ensure that you are taking daily omeprazole  as well.  A prescription refill for this was also sent to your pharmacy.  You were given IV fluids in the emergency department for hydration.  Ensure that you drink plenty of fluids to continue to replace your recent fluid losses.  Return to the emergency department for any new or worsening symptoms of concern.

## 2024-07-25 NOTE — ED Triage Notes (Addendum)
 Pt. Stated, I got food poison on Sunday and was here on Sunday. Im no better, unable to eat or drink. Im still having N/V .  Had labs drwn on sunday

## 2024-07-25 NOTE — ED Notes (Signed)
 Pt advised that urine sample is needed. Delay explained.

## 2024-08-02 ENCOUNTER — Ambulatory Visit: Admitting: Family Medicine

## 2024-08-02 ENCOUNTER — Encounter: Payer: Self-pay | Admitting: Family Medicine

## 2024-08-02 VITALS — BP 127/72 | HR 70 | Temp 98.3°F | Ht 70.5 in | Wt 210.5 lb

## 2024-08-02 DIAGNOSIS — Z960 Presence of urogenital implants: Secondary | ICD-10-CM

## 2024-08-02 DIAGNOSIS — Z794 Long term (current) use of insulin: Secondary | ICD-10-CM | POA: Diagnosis not present

## 2024-08-02 DIAGNOSIS — Z23 Encounter for immunization: Secondary | ICD-10-CM | POA: Diagnosis not present

## 2024-08-02 DIAGNOSIS — E1149 Type 2 diabetes mellitus with other diabetic neurological complication: Secondary | ICD-10-CM

## 2024-08-02 DIAGNOSIS — E782 Mixed hyperlipidemia: Secondary | ICD-10-CM

## 2024-08-02 DIAGNOSIS — E0590 Thyrotoxicosis, unspecified without thyrotoxic crisis or storm: Secondary | ICD-10-CM | POA: Diagnosis not present

## 2024-08-02 NOTE — Patient Instructions (Signed)
 Schedule eye exam

## 2024-08-02 NOTE — Progress Notes (Signed)
 "     Established patient visit   Patient: [name] [name]   DOB: Dec 21, 1970   53 y.o. Male  MRN: [id number] Visit Date: 08/02/2024  Today's healthcare provider: LAURAINE LOISE BUOY, DO   Chief Complaint  Patient presents with   Follow-up    Patient here for ED follow-up. He was seen at [facility] on 07/25/24 for Nausea and vomiting; Dehydration. Patient has had several visits to the ED for this.  Patient states that he also missed his last appointment due to moving and starting a new job.  Having horrible night sweats, when he wakes up it seems like he is in a puddle.   Subjective    HPI [name] [name] is a 53 year old male with diabetes who presents with worsening sweating and medication management issues.  He experiences excessive nocturnal sweating, describing it as waking up drenched and needing to change sheets frequently. This symptom has worsened significantly compared to previous episodes. He recalls being on methimazole  in the past, which he believes helped reduce the sweating, but he is unsure if he is currently taking it.  He reports dry, scaly skin on his feet and lower legs, along with soreness in his feet. He is uncertain about the medications he should be taking for these symptoms.  He has diabetes and is currently managing it with insulin  and a Dexcom meter. He mentions that his A1c is likely elevated due to recent stressors, including a job relocation and not consistently wearing his Dexcom device.  He expresses confusion and frustration over his current medication regimen, stating that he is only taking insulin  and is unsure about the purpose of other medications he has at home. He wants to start fresh with his prescriptions.  He mentions having an implant related to urological issues and is concerned about future management of this condition. He does not currently have a urologist in his new location (locally).  He has received a flu shot and  other vaccinations recently but has not yet received the COVID booster.      Medications: Show/hide medication list[1]       Objective    BP 127/72 (BP Location: Left Arm, Patient Position: Sitting, Cuff Size: Large)   Pulse 70   Temp 98.3 F (36.8 C) (Oral)   Ht 5' 10.5 (1.791 m)   Wt 210 lb 8 oz (95.5 kg)   SpO2 100%   BMI 29.78 kg/m     Physical Exam Vitals and nursing note reviewed.  Constitutional:      General: He is not in acute distress.    Appearance: Normal appearance.  HENT:     Head: Normocephalic and atraumatic.  Eyes:     General: No scleral icterus.    Conjunctiva/sclera: Conjunctivae normal.  Cardiovascular:     Rate and Rhythm: Normal rate.  Pulmonary:     Effort: Pulmonary effort is normal.  Neurological:     Mental Status: He is alert and oriented to person, place, and time. Mental status is at baseline.  Psychiatric:        Mood and Affect: Mood normal.        Behavior: Behavior normal.      Results for orders placed or performed in visit on 08/02/24  TSH  Result Value Ref Range   TSH 0.504 0.450 - 4.500 uIU/mL  Hemoglobin A1c  Result Value Ref Range   Hgb A1c MFr Bld 6.6 (H) 4.8 - 5.6 %  Est. 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 Previous methimazole  was only partially effective, and patient discontinued the medication, leading to worsening of his symptoms. - Restart methimazole . - Check TSH levels.  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 Neuropathy symptoms present. A1c expected to be elevated. - Checked A1c levels. - Continue insulin  therapy. - Restart lisinopril  2.5 mg for renal protection. - Consider restarting Farxiga for kidney protection. - Refer to endocrinology locally for further management  Mixed hyperlipidemia Previously managed with atorvastatin  10 mg daily, until patient discontinued the medication due to not understanding what it was prescribed for. - Restart atorvastatin .  S/p insertion of penile implant Noted.  No follow-up since moving to the area.  Refer to local urology for further management.  General Health Maintenance Due for second dose of hepatitis B vaccine; postponed per patient preference. COVID booster pending. - Advised to receive COVID booster at local pharmacy.    Return in about 5 weeks (around 09/06/2024) for Recheck, Chronic f/u, and in 4 months for TOC with Dr. Sharma.      I discussed the assessment and treatment plan with the patient  The patient was provided an opportunity to ask questions and all were answered. The patient agreed with the plan and demonstrated an understanding of the instructions.   The patient was advised to call back or seek an in-person evaluation if the symptoms worsen or if the condition fails to improve as anticipated.    LAURAINE LOISE BUOY, DO  [facility] [phone] (phone) [phone] (fax)  [facility]     [1]  Outpatient Medications Prior to Visit  Medication Sig   Continuous Glucose Sensor (DEXCOM G7 SENSOR) MISC 1 each by Does not apply route as directed. Every 10 days   Insulin  Aspart FlexPen (NOVOLOG ) 100 UNIT/ML Inject 6 Units into the skin daily as needed (high blood sugar).   atorvastatin  (LIPITOR) 10 MG tablet Take 1 tablet by mouth at bedtime. (Patient not taking: Reported on 08/02/2024)   Cholecalciferol (VITAMIN D3) 125 MCG (5000 UT) CAPS Take 1 capsule by  mouth daily. (Patient not taking: Reported on 08/02/2024)   FARXIGA 5 MG TABS tablet Take 1 tablet by mouth daily. (Patient not taking: Reported on 08/02/2024)   lisinopril  (ZESTRIL ) 2.5 MG tablet Take 1 tablet by mouth daily. (Patient not taking: Reported on 08/02/2024)   methimazole  (TAPAZOLE ) 10 MG tablet Take 1 tablet (10 mg total) by mouth 3 (three) times daily. (Patient not taking: Reported on 08/02/2024)   [DISCONTINUED] amoxicillin -clavulanate (AUGMENTIN ) 875-125 MG tablet Take 1 tablet by mouth 2 (two) times daily. (Patient not taking: Reported on 08/02/2024)   [DISCONTINUED] ciclopirox  (PENLAC ) 8 % solution Apply topically at bedtime. Apply over nail and surrounding skin. Apply daily over previous coat. After seven (7) days, may remove with alcohol and continue cycle. (Patient not taking: Reported on 08/02/2024)   [DISCONTINUED] dicyclomine  (BENTYL ) 20 MG tablet Take 1 tablet (20 mg  total) by mouth 2 (two) times daily. (Patient not taking: Reported on 08/02/2024)   [DISCONTINUED] fluticasone  (FLONASE ) 50 MCG/ACT nasal spray Place 2 sprays into both nostrils daily. (Patient not taking: Reported on 08/02/2024)   [DISCONTINUED] omeprazole  (PRILOSEC) 20 MG capsule Take 1 capsule (20 mg total) by mouth daily. (Patient not taking: Reported on 08/02/2024)   [DISCONTINUED] ondansetron  (ZOFRAN -ODT) 4 MG disintegrating tablet Take 1 tablet (4 mg total) by mouth every 8 (eight) hours as needed for nausea or vomiting. (Patient not taking: Reported on 08/02/2024)   [DISCONTINUED] ondansetron  (ZOFRAN -ODT) 4 MG disintegrating tablet Take 1 tablet (4 mg total) by mouth every 8 (eight) hours as needed. (Patient not taking: Reported on 08/02/2024)   [DISCONTINUED] promethazine  (PHENERGAN ) 25 MG suppository Place 1 suppository (25 mg total) rectally every 6 (six) hours as needed for refractory nausea / vomiting. (Patient not taking: Reported on 08/02/2024)   No facility-administered medications prior to visit.    "

## 2024-08-03 LAB — TSH: TSH: 0.504 u[IU]/mL (ref 0.450–4.500)

## 2024-08-03 LAB — HEMOGLOBIN A1C
Est. average glucose Bld gHb Est-mCnc: 143 mg/dL
Hgb A1c MFr Bld: 6.6 % — ABNORMAL HIGH (ref 4.8–5.6)

## 2024-08-14 ENCOUNTER — Ambulatory Visit: Admitting: Family Medicine

## 2024-08-17 ENCOUNTER — Ambulatory Visit: Payer: Self-pay | Admitting: Family Medicine

## 2024-08-17 ENCOUNTER — Ambulatory Visit: Admitting: Family Medicine

## 2024-09-01 ENCOUNTER — Other Ambulatory Visit: Payer: Self-pay | Admitting: Family Medicine

## 2024-09-07 ENCOUNTER — Ambulatory Visit: Admitting: Family Medicine

## 2024-12-05 ENCOUNTER — Encounter: Admitting: Family Medicine

## 2025-01-09 ENCOUNTER — Ambulatory Visit

## 2025-01-16 ENCOUNTER — Ambulatory Visit

## 2025-01-23 ENCOUNTER — Ambulatory Visit

## 2025-01-30 ENCOUNTER — Ambulatory Visit

## 2025-02-06 ENCOUNTER — Ambulatory Visit

## 2025-02-13 ENCOUNTER — Ambulatory Visit

## 2025-02-20 ENCOUNTER — Ambulatory Visit

## 2025-02-27 ENCOUNTER — Ambulatory Visit

## 2025-03-06 ENCOUNTER — Ambulatory Visit

## 2025-03-13 ENCOUNTER — Ambulatory Visit

## 2025-03-20 ENCOUNTER — Ambulatory Visit

## 2025-03-27 ENCOUNTER — Ambulatory Visit

## 2025-04-03 ENCOUNTER — Ambulatory Visit
